# Patient Record
Sex: Male | Born: 1935 | Race: White | Hispanic: No | Marital: Married | Smoking: Former smoker
Health system: Southern US, Community
[De-identification: ages and names within clinical notes are randomized; demographics above are authoritative.]

## PROBLEM LIST (undated history)

## (undated) DIAGNOSIS — R0989 Other specified symptoms and signs involving the circulatory and respiratory systems: Secondary | ICD-10-CM

## (undated) DIAGNOSIS — J189 Pneumonia, unspecified organism: Secondary | ICD-10-CM

## (undated) DIAGNOSIS — M1990 Unspecified osteoarthritis, unspecified site: Secondary | ICD-10-CM

## (undated) DIAGNOSIS — C61 Malignant neoplasm of prostate: Secondary | ICD-10-CM

## (undated) DIAGNOSIS — K8020 Calculus of gallbladder without cholecystitis without obstruction: Secondary | ICD-10-CM

## (undated) DIAGNOSIS — I739 Peripheral vascular disease, unspecified: Secondary | ICD-10-CM

## (undated) DIAGNOSIS — D126 Benign neoplasm of colon, unspecified: Secondary | ICD-10-CM

## (undated) DIAGNOSIS — E785 Hyperlipidemia, unspecified: Secondary | ICD-10-CM

## (undated) DIAGNOSIS — M4310 Spondylolisthesis, site unspecified: Secondary | ICD-10-CM

## (undated) DIAGNOSIS — M47817 Spondylosis without myelopathy or radiculopathy, lumbosacral region: Secondary | ICD-10-CM

## (undated) DIAGNOSIS — J449 Chronic obstructive pulmonary disease, unspecified: Secondary | ICD-10-CM

## (undated) DIAGNOSIS — K219 Gastro-esophageal reflux disease without esophagitis: Secondary | ICD-10-CM

## (undated) DIAGNOSIS — M8580 Other specified disorders of bone density and structure, unspecified site: Secondary | ICD-10-CM

## (undated) DIAGNOSIS — C4491 Basal cell carcinoma of skin, unspecified: Secondary | ICD-10-CM

## (undated) HISTORY — DX: Chronic obstructive pulmonary disease, unspecified: J44.9

## (undated) HISTORY — DX: Malignant neoplasm of prostate: C61

## (undated) HISTORY — DX: Unspecified osteoarthritis, unspecified site: M19.90

## (undated) HISTORY — DX: Peripheral vascular disease, unspecified: I73.9

## (undated) HISTORY — DX: Basal cell carcinoma of skin, unspecified: C44.91

## (undated) HISTORY — DX: Spondylosis without myelopathy or radiculopathy, lumbosacral region: M47.817

## (undated) HISTORY — DX: Benign neoplasm of colon, unspecified: D12.6

## (undated) HISTORY — DX: Other specified disorders of bone density and structure, unspecified site: M85.80

## (undated) HISTORY — PX: OTHER SURGICAL HISTORY: SHX169

## (undated) HISTORY — DX: Pneumonia, unspecified organism: J18.9

## (undated) HISTORY — DX: Spondylolisthesis, site unspecified: M43.10

## (undated) HISTORY — PX: CHOLECYSTECTOMY: SHX55

## (undated) HISTORY — DX: Hyperlipidemia, unspecified: E78.5

## (undated) HISTORY — DX: Calculus of gallbladder without cholecystitis without obstruction: K80.20

## (undated) HISTORY — DX: Other specified symptoms and signs involving the circulatory and respiratory systems: R09.89

---

## 1988-11-08 HISTORY — PX: EYE SURGERY: SHX253

## 1997-10-04 ENCOUNTER — Ambulatory Visit (HOSPITAL_COMMUNITY): Admission: RE | Admit: 1997-10-04 | Discharge: 1997-10-04 | Payer: Self-pay | Admitting: Rheumatology

## 1998-08-10 ENCOUNTER — Emergency Department (HOSPITAL_COMMUNITY): Admission: EM | Admit: 1998-08-10 | Discharge: 1998-08-10 | Payer: Self-pay | Admitting: Internal Medicine

## 2000-01-01 ENCOUNTER — Ambulatory Visit (HOSPITAL_COMMUNITY): Admission: RE | Admit: 2000-01-01 | Discharge: 2000-01-01 | Payer: Self-pay | Admitting: Ophthalmology

## 2000-01-09 ENCOUNTER — Ambulatory Visit (HOSPITAL_COMMUNITY): Admission: RE | Admit: 2000-01-09 | Discharge: 2000-01-09 | Payer: Self-pay | Admitting: Ophthalmology

## 2000-02-26 ENCOUNTER — Inpatient Hospital Stay (HOSPITAL_COMMUNITY): Admission: RE | Admit: 2000-02-26 | Discharge: 2000-03-02 | Payer: Self-pay | Admitting: Orthopaedic Surgery

## 2000-02-26 ENCOUNTER — Encounter (INDEPENDENT_AMBULATORY_CARE_PROVIDER_SITE_OTHER): Payer: Self-pay | Admitting: *Deleted

## 2002-10-05 ENCOUNTER — Encounter: Payer: Self-pay | Admitting: Unknown Physician Specialty

## 2002-10-05 ENCOUNTER — Ambulatory Visit (HOSPITAL_COMMUNITY): Admission: RE | Admit: 2002-10-05 | Discharge: 2002-10-05 | Payer: Self-pay | Admitting: Unknown Physician Specialty

## 2002-12-02 ENCOUNTER — Encounter (INDEPENDENT_AMBULATORY_CARE_PROVIDER_SITE_OTHER): Payer: Self-pay | Admitting: *Deleted

## 2002-12-02 ENCOUNTER — Ambulatory Visit (HOSPITAL_COMMUNITY): Admission: RE | Admit: 2002-12-02 | Discharge: 2002-12-02 | Payer: Self-pay | Admitting: Gastroenterology

## 2003-05-30 ENCOUNTER — Ambulatory Visit (HOSPITAL_BASED_OUTPATIENT_CLINIC_OR_DEPARTMENT_OTHER): Admission: RE | Admit: 2003-05-30 | Discharge: 2003-05-30 | Payer: Self-pay | Admitting: Orthopedic Surgery

## 2004-03-26 ENCOUNTER — Ambulatory Visit (HOSPITAL_COMMUNITY): Admission: RE | Admit: 2004-03-26 | Discharge: 2004-03-26 | Payer: Self-pay | Admitting: Family Medicine

## 2004-03-29 ENCOUNTER — Encounter: Admission: RE | Admit: 2004-03-29 | Discharge: 2004-03-29 | Payer: Self-pay | Admitting: General Surgery

## 2004-04-17 ENCOUNTER — Encounter (INDEPENDENT_AMBULATORY_CARE_PROVIDER_SITE_OTHER): Payer: Self-pay | Admitting: *Deleted

## 2004-04-17 ENCOUNTER — Observation Stay (HOSPITAL_COMMUNITY): Admission: RE | Admit: 2004-04-17 | Discharge: 2004-04-18 | Payer: Self-pay | Admitting: General Surgery

## 2004-09-10 ENCOUNTER — Observation Stay (HOSPITAL_COMMUNITY): Admission: EM | Admit: 2004-09-10 | Discharge: 2004-09-11 | Payer: Self-pay | Admitting: Emergency Medicine

## 2005-06-01 ENCOUNTER — Inpatient Hospital Stay (HOSPITAL_COMMUNITY): Admission: EM | Admit: 2005-06-01 | Discharge: 2005-06-06 | Payer: Self-pay | Admitting: Emergency Medicine

## 2005-06-01 ENCOUNTER — Ambulatory Visit: Payer: Self-pay | Admitting: Internal Medicine

## 2005-06-02 ENCOUNTER — Ambulatory Visit: Payer: Self-pay | Admitting: *Deleted

## 2005-06-04 ENCOUNTER — Encounter (INDEPENDENT_AMBULATORY_CARE_PROVIDER_SITE_OTHER): Payer: Self-pay | Admitting: Specialist

## 2005-06-12 ENCOUNTER — Ambulatory Visit: Payer: Self-pay | Admitting: *Deleted

## 2005-06-23 ENCOUNTER — Ambulatory Visit: Payer: Self-pay | Admitting: Cardiology

## 2005-06-23 ENCOUNTER — Encounter (HOSPITAL_COMMUNITY): Admission: RE | Admit: 2005-06-23 | Discharge: 2005-07-23 | Payer: Self-pay | Admitting: *Deleted

## 2005-06-28 ENCOUNTER — Emergency Department (HOSPITAL_COMMUNITY): Admission: EM | Admit: 2005-06-28 | Discharge: 2005-06-28 | Payer: Self-pay | Admitting: Emergency Medicine

## 2005-06-30 ENCOUNTER — Ambulatory Visit: Payer: Self-pay | Admitting: *Deleted

## 2005-12-18 ENCOUNTER — Emergency Department (HOSPITAL_COMMUNITY): Admission: EM | Admit: 2005-12-18 | Discharge: 2005-12-18 | Payer: Self-pay | Admitting: Emergency Medicine

## 2008-06-07 ENCOUNTER — Ambulatory Visit: Payer: Self-pay | Admitting: Cardiology

## 2008-06-16 ENCOUNTER — Encounter: Payer: Self-pay | Admitting: Cardiology

## 2008-06-16 ENCOUNTER — Ambulatory Visit: Payer: Self-pay

## 2008-06-27 DIAGNOSIS — C61 Malignant neoplasm of prostate: Secondary | ICD-10-CM

## 2008-06-27 DIAGNOSIS — M069 Rheumatoid arthritis, unspecified: Secondary | ICD-10-CM | POA: Insufficient documentation

## 2008-06-27 DIAGNOSIS — I080 Rheumatic disorders of both mitral and aortic valves: Secondary | ICD-10-CM

## 2008-06-27 DIAGNOSIS — I359 Nonrheumatic aortic valve disorder, unspecified: Secondary | ICD-10-CM | POA: Insufficient documentation

## 2008-06-28 ENCOUNTER — Ambulatory Visit: Payer: Self-pay | Admitting: Cardiology

## 2008-11-22 ENCOUNTER — Encounter: Payer: Self-pay | Admitting: Cardiology

## 2009-01-12 ENCOUNTER — Encounter: Payer: Self-pay | Admitting: Cardiology

## 2009-01-12 ENCOUNTER — Encounter: Payer: Self-pay | Admitting: Internal Medicine

## 2009-01-13 ENCOUNTER — Inpatient Hospital Stay (HOSPITAL_COMMUNITY): Admission: EM | Admit: 2009-01-13 | Discharge: 2009-01-15 | Payer: Self-pay | Admitting: Emergency Medicine

## 2009-01-13 ENCOUNTER — Ambulatory Visit: Payer: Self-pay | Admitting: Internal Medicine

## 2009-01-14 ENCOUNTER — Encounter: Payer: Self-pay | Admitting: Internal Medicine

## 2009-01-15 ENCOUNTER — Encounter (INDEPENDENT_AMBULATORY_CARE_PROVIDER_SITE_OTHER): Payer: Self-pay | Admitting: *Deleted

## 2009-01-16 ENCOUNTER — Encounter: Payer: Self-pay | Admitting: Internal Medicine

## 2009-01-28 ENCOUNTER — Inpatient Hospital Stay (HOSPITAL_COMMUNITY): Admission: EM | Admit: 2009-01-28 | Discharge: 2009-01-31 | Payer: Self-pay | Admitting: Emergency Medicine

## 2009-02-14 ENCOUNTER — Encounter: Payer: Self-pay | Admitting: Cardiology

## 2009-02-14 ENCOUNTER — Encounter: Payer: Self-pay | Admitting: Internal Medicine

## 2009-02-15 ENCOUNTER — Ambulatory Visit: Payer: Self-pay | Admitting: Internal Medicine

## 2009-02-15 DIAGNOSIS — Z860100 Personal history of colon polyps, unspecified: Secondary | ICD-10-CM | POA: Insufficient documentation

## 2009-02-15 DIAGNOSIS — K648 Other hemorrhoids: Secondary | ICD-10-CM | POA: Insufficient documentation

## 2009-04-17 ENCOUNTER — Encounter: Payer: Self-pay | Admitting: Cardiology

## 2009-04-17 ENCOUNTER — Ambulatory Visit: Payer: Self-pay | Admitting: Cardiovascular Disease

## 2009-04-17 ENCOUNTER — Ambulatory Visit: Payer: Self-pay | Admitting: Internal Medicine

## 2009-04-17 ENCOUNTER — Observation Stay (HOSPITAL_COMMUNITY): Admission: EM | Admit: 2009-04-17 | Discharge: 2009-04-18 | Payer: Self-pay | Admitting: Emergency Medicine

## 2009-04-18 ENCOUNTER — Encounter (INDEPENDENT_AMBULATORY_CARE_PROVIDER_SITE_OTHER): Payer: Self-pay | Admitting: Emergency Medicine

## 2009-06-15 ENCOUNTER — Telehealth: Payer: Self-pay | Admitting: Internal Medicine

## 2009-12-25 LAB — HEMOCCULT GUIAC POC 1CARD (OFFICE)

## 2010-04-11 NOTE — Progress Notes (Signed)
Summary: Blood in bowels  Phone Note Call from Patient Call back at Home Phone [phone]   Caller: Spouse-Mattie Call For: Dr Leone Payor Reason for Call: Talk to Nurse Summary of Call: Blood in his bowels. Unsure what to do. Initial call taken by: Leanor Kail [facility],  June 15, 2009 9:23 AM  Follow-up for Phone Call        phone busy, I will continue to try and reach patient  Darcey Nora RN, [facility]  June 15, 2009 10:26 AM  Patient with blood in the stool and blood on tissue when he wipes.  I have scheduled an REV for 07/23/09 11:30, I have also called in a refill for his suppositories for hemorrhoids. Follow-up by: Darcey Nora RN, CGRN,  June 15, 2009 11:02 AM    New/Updated Medications: ANUSOL-HC 25 MG  SUPP (HYDROCORTISONE ACETATE) insert rectally as needed for hemorrhoidal pain/bleeding Prescriptions: ANUSOL-HC 25 MG  SUPP (HYDROCORTISONE ACETATE) insert rectally as needed for hemorrhoidal pain/bleeding  #14 x 0   Entered by:   Darcey Nora RN, CGRN   Authorized by:   Iva Boop MD, [facility]   Signed by:   Darcey Nora RN, CGRN on 06/15/2009   Method used:   Faxed to ...       [facility] (retail)       [address]       [place], [place]  [address]       Ph: [phone]       Fax: [phone]   RxID:   [id number]

## 2010-04-11 NOTE — Letter (Signed)
Summary: [name]  [facility] Office Note   Imported By: Roderic Ovens 05/04/2009 16:18:38  _____________________________________________________________________  External Attachment:    Type:   Image     Comment:   External Document  Appended Document: [name] Note reviewed. Patient sent to ER by [facility]. TS

## 2010-05-29 LAB — COMPREHENSIVE METABOLIC PANEL
ALT: 10 U/L (ref 0–53)
ALT: 11 U/L (ref 0–53)
AST: 20 U/L (ref 0–37)
AST: 22 U/L (ref 0–37)
Albumin: 3.2 g/dL — ABNORMAL LOW (ref 3.5–5.2)
Alkaline Phosphatase: 55 U/L (ref 39–117)
CO2: 23 mEq/L (ref 19–32)
Calcium: 8.7 mg/dL (ref 8.4–10.5)
Chloride: 110 mEq/L (ref 96–112)
Creatinine, Ser: 0.74 mg/dL (ref 0.4–1.5)
GFR calc Af Amer: >60 mL/min (ref >60)
GFR calc Af Amer: >60 mL/min (ref >60)
GFR calc non Af Amer: >60 mL/min (ref >60)
Glucose, Bld: 60 mg/dL — ABNORMAL LOW (ref 70–99)
Potassium: 3.5 mEq/L (ref 3.5–5.1)
Sodium: 139 mEq/L (ref 135–145)
Sodium: 140 mEq/L (ref 135–145)
Total Bilirubin: 0.9 mg/dL (ref 0.3–1.2)
Total Protein: 6.9 g/dL (ref 6.0–8.3)

## 2010-05-29 LAB — URINALYSIS, ROUTINE W REFLEX MICROSCOPIC
Glucose, UA: NEGATIVE mg/dL
Ketones, ur: NEGATIVE mg/dL
Nitrite: NEGATIVE
Protein, ur: NEGATIVE mg/dL
Urobilinogen, UA: 0.2 mg/dL (ref 0.0–1.0)

## 2010-05-29 LAB — POCT I-STAT, CHEM 8
Creatinine, Ser: 0.7 mg/dL (ref 0.4–1.5)
Glucose, Bld: 90 mg/dL (ref 70–99)
Hemoglobin: 12.2 g/dL — ABNORMAL LOW (ref 13.0–17.0)
TCO2: 27 mmol/L (ref 0–100)

## 2010-05-29 LAB — DIFFERENTIAL
Basophils Absolute: 0 10*3/uL (ref 0.0–0.1)
Lymphocytes Relative: 7 % — ABNORMAL LOW (ref 12–46)
Lymphs Abs: 0.6 10*3/uL — ABNORMAL LOW (ref 0.7–4.0)
Neutro Abs: 7.3 10*3/uL (ref 1.7–7.7)
Neutrophils Relative %: 87 % — ABNORMAL HIGH (ref 43–77)

## 2010-05-29 LAB — CARDIAC PANEL(CRET KIN+CKTOT+MB+TROPI)
CK, MB: 1.2 ng/mL (ref 0.3–4.0)
Relative Index: INVALID (ref 0.0–2.5)
Total CK: 63 U/L (ref 7–232)

## 2010-05-29 LAB — CBC
MCV: 91.1 fL (ref 78.0–100.0)
Platelets: 199 10*3/uL (ref 150–400)
RBC: 3.32 MIL/uL — ABNORMAL LOW (ref 4.22–5.81)
RDW: 17.2 % — ABNORMAL HIGH (ref 11.5–15.5)
WBC: 4.7 10*3/uL (ref 4.0–10.5)
WBC: 8.4 10*3/uL (ref 4.0–10.5)

## 2010-05-29 LAB — CK TOTAL AND CKMB (NOT AT ARMC)
Relative Index: INVALID (ref 0.0–2.5)
Total CK: 28 U/L (ref 7–232)

## 2010-05-29 LAB — GLUCOSE, CAPILLARY: Glucose-Capillary: 86 mg/dL (ref 70–99)

## 2010-05-29 LAB — PROTIME-INR
INR: 1.09 (ref 0.00–1.49)
Prothrombin Time: 14 seconds (ref 11.6–15.2)

## 2010-05-29 LAB — D-DIMER, QUANTITATIVE: D-Dimer, Quant: 1.51 ug/mL-FEU — ABNORMAL HIGH (ref 0.00–0.48)

## 2010-05-29 LAB — TSH: TSH: 2.827 u[IU]/mL (ref 0.350–4.500)

## 2010-05-29 LAB — CORTISOL: Cortisol, Plasma: 16.4 ug/dL

## 2010-06-11 ENCOUNTER — Encounter: Payer: Self-pay | Admitting: Family Medicine

## 2010-06-11 DIAGNOSIS — I739 Peripheral vascular disease, unspecified: Secondary | ICD-10-CM | POA: Insufficient documentation

## 2010-06-11 DIAGNOSIS — R0989 Other specified symptoms and signs involving the circulatory and respiratory systems: Secondary | ICD-10-CM

## 2010-06-11 DIAGNOSIS — J449 Chronic obstructive pulmonary disease, unspecified: Secondary | ICD-10-CM

## 2010-06-11 DIAGNOSIS — M4310 Spondylolisthesis, site unspecified: Secondary | ICD-10-CM | POA: Insufficient documentation

## 2010-06-11 DIAGNOSIS — M47817 Spondylosis without myelopathy or radiculopathy, lumbosacral region: Secondary | ICD-10-CM

## 2010-06-11 DIAGNOSIS — E785 Hyperlipidemia, unspecified: Secondary | ICD-10-CM

## 2010-06-11 DIAGNOSIS — M8580 Other specified disorders of bone density and structure, unspecified site: Secondary | ICD-10-CM | POA: Insufficient documentation

## 2010-06-11 DIAGNOSIS — C4491 Basal cell carcinoma of skin, unspecified: Secondary | ICD-10-CM | POA: Insufficient documentation

## 2010-06-12 LAB — COMPREHENSIVE METABOLIC PANEL
ALT: 12 U/L (ref 0–53)
AST: 22 U/L (ref 0–37)
Albumin: 3.4 g/dL — ABNORMAL LOW (ref 3.5–5.2)
Albumin: 3.1 g/dL — ABNORMAL LOW (ref 3.5–5.2)
Albumin: 3.8 g/dL (ref 3.5–5.2)
Alkaline Phosphatase: 60 U/L (ref 39–117)
Alkaline Phosphatase: 69 U/L (ref 39–117)
BUN: 13 mg/dL (ref 6–23)
BUN: 9 mg/dL (ref 6–23)
Calcium: 8.4 mg/dL (ref 8.4–10.5)
Calcium: 8.8 mg/dL (ref 8.4–10.5)
Chloride: 106 mEq/L (ref 96–112)
Chloride: 105 mEq/L (ref 96–112)
Creatinine, Ser: 0.73 mg/dL (ref 0.4–1.5)
GFR calc Af Amer: >60 mL/min (ref >60)
Glucose, Bld: 100 mg/dL — ABNORMAL HIGH (ref 70–99)
Potassium: 3.5 mEq/L (ref 3.5–5.1)
Potassium: 3.7 mEq/L (ref 3.5–5.1)
Sodium: 140 mEq/L (ref 135–145)
Sodium: 143 mEq/L (ref 135–145)
Total Bilirubin: 0.9 mg/dL (ref 0.3–1.2)
Total Protein: 6.4 g/dL (ref 6.0–8.3)
Total Protein: 6.2 g/dL (ref 6.0–8.3)
Total Protein: 7.4 g/dL (ref 6.0–8.3)

## 2010-06-12 LAB — CROSSMATCH: ABO/RH(D): O POS

## 2010-06-12 LAB — TYPE AND SCREEN: Antibody Screen: NEGATIVE

## 2010-06-12 LAB — DIFFERENTIAL
Eosinophils Relative: 1 % (ref 0–5)
Eosinophils Relative: 1 % (ref 0–5)
Lymphocytes Relative: 17 % (ref 12–46)
Lymphs Abs: 1.3 10*3/uL (ref 0.7–4.0)
Monocytes Absolute: 0.6 10*3/uL (ref 0.1–1.0)
Monocytes Relative: 6 % (ref 3–12)
Monocytes Relative: 8 % (ref 3–12)
Neutro Abs: 7.5 10*3/uL (ref 1.7–7.7)
Neutrophils Relative %: 79 % — ABNORMAL HIGH (ref 43–77)

## 2010-06-12 LAB — HEMOGLOBIN AND HEMATOCRIT, BLOOD
HCT: 29.8 % — ABNORMAL LOW (ref 39.0–52.0)
HCT: 30.1 % — ABNORMAL LOW (ref 39.0–52.0)
HCT: 30.7 % — ABNORMAL LOW (ref 39.0–52.0)
HCT: 31.9 % — ABNORMAL LOW (ref 39.0–52.0)
Hemoglobin: 10.4 g/dL — ABNORMAL LOW (ref 13.0–17.0)
Hemoglobin: 10.4 g/dL — ABNORMAL LOW (ref 13.0–17.0)
Hemoglobin: 10.9 g/dL — ABNORMAL LOW (ref 13.0–17.0)

## 2010-06-12 LAB — CBC
HCT: 31.8 % — ABNORMAL LOW (ref 39.0–52.0)
HCT: 31.1 % — ABNORMAL LOW (ref 39.0–52.0)
Hemoglobin: 10.4 g/dL — ABNORMAL LOW (ref 13.0–17.0)
Hemoglobin: 9.1 g/dL — ABNORMAL LOW (ref 13.0–17.0)
Hemoglobin: 11 g/dL — ABNORMAL LOW (ref 13.0–17.0)
MCHC: 32.5 g/dL (ref 30.0–36.0)
MCHC: 33.3 g/dL (ref 30.0–36.0)
MCHC: 33.8 g/dL (ref 30.0–36.0)
MCHC: 34 g/dL (ref 30.0–36.0)
MCHC: 34.1 g/dL (ref 30.0–36.0)
MCV: 96 fL (ref 78.0–100.0)
MCV: 96.5 fL (ref 78.0–100.0)
Platelets: 168 10*3/uL (ref 150–400)
Platelets: 201 10*3/uL (ref 150–400)
RBC: 2.87 MIL/uL — ABNORMAL LOW (ref 4.22–5.81)
RBC: 2.9 MIL/uL — ABNORMAL LOW (ref 4.22–5.81)
RBC: 3.32 MIL/uL — ABNORMAL LOW (ref 4.22–5.81)
RBC: 3.34 MIL/uL — ABNORMAL LOW (ref 4.22–5.81)
RDW: 16.2 % — ABNORMAL HIGH (ref 11.5–15.5)
WBC: 9.5 10*3/uL (ref 4.0–10.5)
WBC: 7.5 10*3/uL (ref 4.0–10.5)
WBC: 8.5 10*3/uL (ref 4.0–10.5)

## 2010-06-12 LAB — IRON AND TIBC
Iron: 41 ug/dL — ABNORMAL LOW (ref 42–135)
Saturation Ratios: 15 % — ABNORMAL LOW (ref 20–55)
TIBC: 282 ug/dL (ref 215–435)

## 2010-06-12 LAB — FOLATE: Folate: 13.6 ng/mL

## 2010-06-12 LAB — PROTIME-INR
INR: 1.13 (ref 0.00–1.49)
Prothrombin Time: 14.4 seconds (ref 11.6–15.2)

## 2010-06-12 LAB — RETICULOCYTES: Retic Count, Absolute: 70.4 10*3/uL (ref 19.0–186.0)

## 2010-07-23 NOTE — Assessment & Plan Note (Signed)
HEALTHCARE                            CARDIOLOGY OFFICE NOTE   NAME:[name], [name]                       MRN:          [id number]  DATE:06/28/2008                            DOB:          06/11/1930    PRIMARY CARE PHYSICIAN:  Ernestina Penna, MD   REASON FOR PRESENTATION:  Evaluated the patient with leg pain.   HISTORY OF PRESENT ILLNESS:  The patient returns for followup after his  June 07, 2008, appointment.  He was complaining predominantly of leg  and foot pain.  He did have reduced pulses, particularly on the left  leg.  However, ABIs that I ordered demonstrated normal waveform and no  suggestion of obstructive vascular disease.  He also had a heart murmur  and the echocardiogram confirmed moderate mitral regurgitation but  otherwise well-preserved ejection fraction.  Finally, he had carotid  bruits and was found to have less than 40% bilateral carotid stenosis.  He denies any symptoms such as chest pressure, neck or arm discomfort.  He is not having any palpitations, presyncope, or syncope.  He has no  PND or orthopnea.  He gets pain when he stands on his foot or stands on  his feet as described in the previous note.   PAST MEDICAL HISTORY:  1. Mild-to-moderate mitral regurgitation.  2. Aortic sclerosis.  3. Prostate cancer.  4. Rheumatoid arthritis.  5. Left foot surgery.  6. Cholecystectomy.  7. Right hip replacement.   ALLERGIES:  None.   MEDICATIONS:  1. Methotrexate 2.5 mg q.i.d.  2. Hydroxychloroquine.  3. Prednisone 5 mg daily.  4. Iron 324 mg b.i.d.  5. Protonix 40 mg daily.   REVIEW OF SYSTEMS:  As stated in the HPI and otherwise negative for all  other systems.   PHYSICAL EXAMINATION:  GENERAL:  The patient is in no distress.  VITAL SIGNS:  Blood pressure 144/84, heart rate 76 and regular, weight  150 pounds.  HEENT:  Eyes unremarkable.  Pupils equal, round, and reactive to light.  Fundi not visualized.  Oral  mucosa unremarkable.  NECK:  No jugular venous distention at 45 degrees.  Carotid upstroke  brisk and symmetric.  No bruits.  Bilateral carotid bruits.  No  thyromegaly.  LYMPHATICS:  No adenopathy.  LUNGS:  Clear to auscultation bilaterally.  HEART:  PMI not displaced or sustained.  S1 and S2 within normal limits.  No S3, no S4.  3/6 apical systolic murmur radiating to the axilla, no  diastolic murmurs.  ABDOMEN:  Flat.  Positive bowel sounds, normal frequency and pitch.  No  bruits, no rebound, no guarding.  No midline pulsatile mass.  No  hepatomegaly.  SKIN:  No rashes, no nodules.  EXTREMITIES:  A 2+ pulses upper, 2+ femorals without bruits, 2+  popliteals, 2+ posterior tibialis and dorsalis pedis on the right,  absent dorsalis pedis and posterior tibialis on the left.  No cyanosis,  no clubbing, no edema.  NEURO:  Grossly intact.   ASSESSMENT AND PLAN:  1. Foot pain.  Surprisingly, he has normal ABIs.  I would not have  thought this by his physical exam.  However, I cannot now ascribe      his symptoms to claudication and wonder if it could be neuropathic.      No further cardiovascular workup is suggested at this point.  2. Carotid stenosis.  The patient has mild carotid stenosis and this      can be followed in few years.  3. Mitral regurgitation.  This seems to be stable and moderate.  He      should have his heart listen to routinely.  If his murmur gets      louder or changes or if he redevelops any symptoms, I would repeat      an echocardiogram.  Otherwise, I will see him in a couple of years      to follow up on this problem.  4. Followup.  As above.     Rollene Rotunda, MD, [facility]  Electronically Signed    JH/MedQ  DD: 06/28/2008  DT: 06/29/2008  Job #: [id number]   cc:   Ernestina Penna, M.D.

## 2010-07-23 NOTE — Assessment & Plan Note (Signed)
[facility]                            CARDIOLOGY OFFICE NOTE   NAME:Mander, RAJAN BURGARD                       MRN:          [id number]  DATE:06/07/2008                            DOB:          Jul 24, 1930    PRIMARY CARE PHYSICIAN:  Ernestina Penna, MD   REASON FOR PRESENTATION:  Evaluate the patient with claudication.   HISTORY OF PRESENT ILLNESS:  The patient is a pleasant 77 year old  gentleman who was seen in 2007 by Dr. Dorethea Clan.  He had an abnormal  electrocardiogram.  He had an echocardiogram at that time, which  demonstrated a well preserved ejection fraction.  There was mild-to-  moderate mitral regurgitation.  He had some aortic valve sclerosis as  well.  He has not required followup since that time.  However, he was  seeing Dr. Christell Constant recently and complaining of foot pain.  This was  particularly in his left foot.  It happens when he has been standing on  for a while.  He also gets some discomfort when he tries to walk and  this seems to be in right thigh.  The symptoms seem to be consistent  with claudication.  It is slowly getting worse.  He is not describing  any resting pain.  He tries to remain active.  He does not get any chest  pressure, neck or arm discomfort.  He does not have any palpitations,  presyncope, or syncope.  He denies any shortness of breath, PND, or  orthopnea.   PAST MEDICAL HISTORY:  Mild-to-moderate mitral regurgitation, aortic  sclerosis, prostate cancer, and rheumatoid arthritis.   PAST SURGICAL HISTORY:  Left foot surgery, cholecystectomy, and right  hip replacement.   ALLERGIES:  None.   MEDICATIONS:  1. Methotrexate 2.5 mg q.i.d.  2. Hydroxychloroquine.  3. Prednisone 5 mg daily.  4. Iron 324 mg b.i.d.  5. Protonix 40 mg daily.   SOCIAL HISTORY:  The patient quit smoking 15 years ago after 1 pack per  day for 40 years.  He does not drink alcohol.  He is retired.  He is  married.  He has 7 children.   FAMILY HISTORY:  Noncontributory for early coronary artery disease.  A  sister had bypass at age 16.  His father died in a tractor accident and  his mother died of lung cancer at age 30.   REVIEW OF SYSTEMS:  As stated in the HPI and otherwise positive for  joint pain and difficulty hearing, negative for all other systems.   PHYSICAL EXAMINATION:  GENERAL:  The patient is pleasant and in no  distress.  VITAL SIGNS:  Blood pressure 140/86, heart rate 80 and regular, body  mass index 20, and weight 150 pounds.  HEENT:  Eyelids unremarkable; pupils equal, round, and reactive to  light; fundi not visualized; oral mucosa unremarkable.  Edentulous.  NECK:  No jugular venous distention at 45 degrees, carotid upstroke  brisk and symmetric, soft carotid bruits, no thyromegaly.  LYMPHATICS:  No cervical, axillary, or inguinal adenopathy.  LUNGS:  Clear to auscultation bilaterally.  BACK:  No costovertebral  angle tenderness.  CHEST:  Unremarkable.  HEART:  PMI not displaced or sustained; S1 and S2 within normal limits,  no S3, no S4; 3/6 apical systolic murmur radiating slightly to the  axilla, holosystolic and radiating slightly out the aortic outflow tract  as well, no diastolic murmurs.  ABDOMEN:  Flat; positive bowel sounds, normal in frequency and pitch; no  bruits, no rebound, no guarding, no midline pulsatile mass, no  hepatomegaly, no splenomegaly.  SKIN:  No rashes, no nodules.  EXTREMITIES:  Upper pulses 2+, 2+ femorals without bruits, 2+  popliteals, 2+ posterior tibialis and dorsalis pedis on the right, but  absent dorsalis pedis and posterior tibialis on the left; no cyanosis,  no clubbing, no edema.  NEURO:  Oriented to person, place, and time; cranial nerves II through  XII grossly intact; motor grossly intact.   EKG; sinus rhythm, rate 80, axis within normal limits, intervals within  normal limits, ST depression, but not changed from previous EKGs at Dr.  Kathi Der office.    ASSESSMENT AND PLAN:  1. Claudication.  The patient is describing some symptoms consistent      with claudication.  He has reduced pulses in his left foot.  I am      going to measure ABIs.  Further management will be based on these      results.  2. Carotid bruits.  He will get carotid Dopplers as well.  3. Mitral regurgitation.  The patient does have murmur consistent with      mitral regurgitation and probably separate murmur consistent with      aortic sclerosis.  It has been 3 years since his last echo and this      needs to be reevaluated.  He will get an echocardiogram.  4. Followup.  I will see him back after the results of all these      studies to further make plans.     Rollene Rotunda, MD, [facility]  Electronically Signed    JH/MedQ  DD: 06/07/2008  DT: 06/08/2008  Job #: [id number]   cc:   Ernestina Penna, M.D.

## 2010-07-26 NOTE — H&P (Signed)
NAME:  [name], [name] NO.:  [id number]   MEDICAL RECORD NO.:  [id number]          PATIENT TYPE:  EMS   LOCATION:  ED                           FACILITY:  [facility]   PHYSICIAN:  Burnard Bunting, M.D.    DATE OF BIRTH:  Apr 03, 1930   DATE OF ADMISSION:  06/28/2005  DATE OF DISCHARGE:                                HISTORY & PHYSICAL   CHIEF COMPLAINT:  Right hip pain.   HISTORY OF PRESENT ILLNESS:  [name] is a 77 year old patient  status post total hip replacement performed by Dr. Ophelia Charter in 2001 with  subsequent dislocation in July, 2006 and again tonight. He was bending over  to tie his shoes and had his hip pop out.   PAST MEDICAL HISTORY:  1.  Notable for rheumatoid arthritis.  2.  Three weeks ago he had Salmonella poisoning and he underwent 8 days of      antibiotic treatment for that.  3.  He did have an EKG stress-test 5 days ago which showed no ischemia.   FAMILY HISTORY:  Family medical history is noncontributory. No history of  DVT.   SOCIAL HISTORY:  He is married for 55 years. Lives in [place].   ALLERGIES:  No known drug allergies.   CURRENT MEDICATIONS:  1.  Methotrexate.  2.  Prednisone.  3.  Hydroxychloroquine.  4.  Darvocet.   REVIEW OF SYSTEMS:  Noncontributory.   PHYSICAL EXAMINATION:  VITAL SIGNS:  Temperature is 98, pulse 83,  respiration 18, blood pressure 140/70.  CHEST:  Clear to auscultation.  HEART:  Regular rate and rhythm.  ABDOMEN:  Benign.  EXTREMITIES:  The right lower extremity is shortened and internally rotated.  Dorsiflexion, plantar flexion is intact. Dorsalis pedis pulse is intact.   STUDIES:  Hemoglobin is 9.5. He has old ST depression on his EKG. Chest x-  ray is clear. Radiographs show a right posterior hip dislocation.   IMPRESSION:  Right posterior hip dislocation.   PLAN:  General anesthetic with closed reduction. Risks and benefits are  discussed with the patient which include but are not limited  to infection,  bone breakage, failure to reduce the hip. Patient understands and wishes to  proceed. All questions answered.           ______________________________  G. Dorene Grebe, M.D.     GSD/MEDQ  D:  06/28/2005  T:  06/28/2005  Job:  [id number]

## 2010-07-26 NOTE — Op Note (Signed)
[name], [name]                ACCOUNT NO.:  [id number]   MEDICAL RECORD NO.:  [id number]          PATIENT TYPE:  EMS   LOCATION:  ED                           FACILITY:  [facility]   PHYSICIAN:  Mark C. Ophelia Charter, M.D.    DATE OF BIRTH:  1930/08/09   DATE OF PROCEDURE:  12/18/2005  DATE OF DISCHARGE:  12/18/2005                                 OPERATIVE REPORT   PREOPERATIVE DIAGNOSIS:  Closed posterior total hip arthroplasty  dislocation, right.   POSTOPERATIVE DIAGNOSIS:  Closed posterior total hip arthroplasty  dislocation, right.   OPERATION PERFORMED:  Closed reduction under general anesthesia, right hip.   SURGEON:  Mark C. Ophelia Charter, M.D.   ANESTHESIA:  General mask.   DESCRIPTION OF PROCEDURE:  After induction of general mask anesthesia after  succinylcholine was given, the hip was flexed 90-90 position with the  surgeon standing and the knee between my legs, countertraction of the  pelvis, gentle traction, hip popped in easily with a nice snap.  Flat plate  x-ray confirmed relocation.  Knee immobilizer applied.  Patient transferred  to recovery room in stable condition.  This is the third dislocation this  patient has had and this occurred when he was sitting on a stair the second  or third step trying to get up by grabbing the rails, flexing his chest to  his knees, internally rotating his hip and pulling.  Transferred to recovery  room in stable condition.  Instrument count, needle count was correct.      Mark C. Ophelia Charter, M.D.  Electronically Signed     MCY/MEDQ  D:  12/18/2005  T:  12/20/2005  Job:  [id number]

## 2010-07-26 NOTE — Discharge Summary (Signed)
[place]  Patient:    [name], [name]                         MRN: [id number] Adm. Date:  [id number] Disc. Date: [id number] Attending:  Jacki Cones                           Discharge Summary  FINAL DIAGNOSIS:  Right hip rheumatoid arthritis.  HISTORY OF PRESENT ILLNESS:  This 77 year old male has seropositive rheumatoid arthritis and past history of prostate cancer and was referred to me by Dr. Larey Dresser for evaluation.  He is on methotrexate and prednisone, hydroxychloroquine, Darvocet, and hydrocodone for his rheumatoid arthritis pain.  See H&P for dosages.  He failed conservative treatment and was admitted for a total hip arthroplasty since he had significant protrusio.  HOSPITAL COURSE:  The patient was admitted and underwent right total hip arthroplasty by Dr. Annell Greening, with Dr. Odette Fraction assisting, under general anesthesia, with estimated blood loss of 450 ml.  The patient had a cemented femoral stem, Press-Fit acetabulum.  Postoperatively the patient had DVT prophylaxis with Coumadin.  PT, OT consult.  Care management for DMEs. Postoperative hemoglobin was 8.8 and stable.  INR was 1.6 on February 28, 2000.  He made slow, gradual progress.  Was ready for discharge home on March 02, 2000.  DISCHARGE MEDICATIONS:  He was given prescription for Coumadin, Vicodin for pain.  Incision was dry.  CONDITION ON DISCHARGE:  Stable. DD:  04/01/00 TD:  04/02/00 Job: [id number]

## 2010-07-26 NOTE — H&P (Signed)
[place]  Patient:    [name], [name]                         MRN: [id number] Adm. Date:  [id number] Attending:  Jacki Cones                         History and Physical  ADMISSION DIAGNOSIS:  Right hip painful rheumatoid arthritis, with destruction.  HISTORY OF PRESENT ILLNESS:  This 77 year old male was referred to me by Dr. Maretta Bees. Vonita Moss, who follows him for prostate cancer, when he had progressive severe right hip pain, with great difficulty ambulating.  He is treated by Dr. Demetria Pore. Levitin, who is his rheumatologist.  CURRENT MEDICATIONS:  He has been on methotrexate 4 mg one p.o. weekly, prednisone 10 mg q.d., hydroxychloroquine p.o. b.i.d., Darvocet b.i.d., and Vicodin increasing dosage for his pain.  ALLERGIES:  No known drug allergies.  PAST MEDICAL HISTORY: 1. Positive for his rheumatoid arthritis. 2. Prostate cancer. 3. Cataract surgery in 1999.  FAMILY HISTORY:  Positive for lung disease.  REVIEW OF SYSTEMS:  The patient had previous cataract surgery.  Seropositive rheumatoid arthritis.  Prostate carcinoma.  The patient does have dentures.  PHYSICAL EXAMINATION:  VITAL SIGNS:  temperature 97.4 degrees, pulse 76, respirations 14, blood pressure 134/72, weight 165 pounds, height 6 feet 1 inches.  GENERAL:  A well-developed, well-nourished, thin male.  The patient has slow deliberate motions getting from the sitting to the standing position.  He ambulates with a significant limp.  He has bilateral hand deformities.  HEENT:  Upper and lower dentures.  TMI.  Pharynx clear.  NECK:  Supple.  BACK:  No kyphotic deformity.  LUNGS:  Clear to auscultation.  HEART:  A regular rate and rhythm without murmur.  ABDOMEN:  Soft, nontender.  No organomegaly.  EXTREMITIES:  There are bilateral wrist and hand deformities with mild tenosynovitis, without evidence of extensor rupture or significant subluxation.  Right hip  shows internal rotation of only 20 degrees, and external rotation of 20 degrees, 60 degrees of hip flexion, which reproduces his pain.  ____ of the left hip has 45 ER, 40 IR, and flexion to 110.  Pulses 2+.  Sciatic nerve function intact.  X-RAYS:  X-rays show severe destruction with mild protrusio, large marginal osteophytes on the acetabulum, and a complete loss of joint space with marginal erosions, secondary to rheumatoid arthritis.  ASSESSMENT:  Right hip severe rheumatoid arthritis with destruction.  PLAN:  Due to the patients use of a cane and progressive increasing amounts of Vicodin, and great difficulty ambulating, and pain that wakes him up at night, and difficulty with daily activities, I would recommend proceeding with a total hip arthroplasty.  He is not a candidate for physical therapy preoperatively, in order to avoid surgery.  He is on maximum medical treatment by Dr. Coral Spikes.  The planned procedure is discussed with the patient.  The risks were discussed.  He understands and requests that we proceed.  The surgery is scheduled on February 26, 2000. DD:  02/26/00 TD:  02/26/00 Job: [id number]

## 2010-07-26 NOTE — H&P (Signed)
NAMEPASHA, [name]                ACCOUNT NO.:  [id number]   MEDICAL RECORD NO.:  [id number]          PATIENT TYPE:  INP   LOCATION:  A204                          FACILITY:  [facility]   PHYSICIAN:  Osvaldo Shipper, MD     DATE OF BIRTH:  1930/04/11   DATE OF ADMISSION:  05/31/2005  DATE OF DISCHARGE:  LH                                HISTORY & PHYSICAL   PRIMARY DOCTOR:  Dr. Christell Constant from [facility].   ONCOLOGIST:  Dr. Vonita Moss.   DERMATOLOGIST:  Dr. Ladona Horns.   ADMITTING DIAGNOSES:  1.  Acute viral gastroenteritis.  2.  Abnormal electrocardiogram requiring further evaluation.  3.  Anemia.  4.  Dehydration.  5.  History of rheumatoid arthritis.   CHIEF COMPLAINT:  Nausea, vomiting, diarrhea for two days.   HISTORY OF PRESENT ILLNESS:  The patient is a 77 year old Caucasian male  with a past medical history of rheumatoid arthritis, who was doing well  until about yesterday morning when he woke up and had an episode of  diarrhea.  This was followed by a nauseous feeling which was followed by  emesis.  He vomited once.  His diarrhea was loose to begin with,  subsequently becoming extremely watery.  He must have had at least more than  12 bowel movements since yesterday and today.  He states whatever he was  eating or drinking would go straight down.  Denied any blood in his stool  though he said at one point there was some reddish colored fluid which he  attributes to red Gatorade.  He has some vague generalized abdominal  discomfort but nothing specific.  He had some fever at home yesterday which  was 101.6.  There is a history of patient eating out on Thursday evening.  He had some kind of a burger.  This afternoon when he was having diarrhea,  he had a few episodes of feeling light-headed and dizzy especially when he  would stand up.  Also when he was in the restroom he had one syncopal  episode.  He did not hurt his head anywhere.  Denied chest pain,  shortness  of breath.  He states he has been having shoulder pain in both his shoulders  for the past two weeks.  There is no pain when he is lying still but when he  moves his arm, especially when he raises it up his head, he feels pain in  his shoulders.   MEDICATIONS AT HOME:  1.  Hydrochlorothiazide which he states he takes for arthritis.  2.  Methotrexate three pills weekly on Sundays, does not know the dosage.  3.  Darvocet for pain.  4.  Prednisone 5 mg daily for rheumatoid arthritis.  5.  Lupron shots every few months for prostate cancer.   ALLERGIES:  No known drug allergies.   PAST MEDICAL HISTORY:  1.  History of rheumatoid arthritis for the past many years.  2.  History of prostate cancer.  His prostate cancer was diagnosed in 1993.   PAST SURGICAL HISTORY:  1.  Total right hip replacement  because of arthritis.  2.  Status post foot and toe surgeries.  3.  Cholecystectomy done in the past.   No history of any heart disease that he knows of.   SOCIAL HISTORY:  Lives in [place] with his wife.  They are involved in  cleaning doctors's offices.  Quit smoking many years ago.  No alcohol use.  No illicit drug use.  He sometimes needs to use a cane.  Otherwise, he is  pretty independent.   FAMILY HISTORY:  Father had heart disease in his 53s, died of in a motor  vehicle accident.  Mother died of lung cancer.  She was 82.  All of his  siblings are healthy.  He had a son who had a heart attack at age of 75.   REVIEW OF SYSTEMS:  Ten point review of systems was unremarkable except as  mentioned in the HPI.  There is no dysuria or hematuria.   PHYSICAL EXAMINATION:  VITAL SIGNS:  Temperature 97.2, he was orthostatic in  that his heart rate went from 77 to 94, his blood pressure came down from  108/58 to 92/61.  Pulse oximetry 98% on room air.  Respiratory rate 16.  GENERAL:  This is an elderly white male in no apparent distress.  Feeling  slightly better.  HEENT:  There  is no pallor or icterus.  Mucous membrane slightly dry.  No  oral lesions are seen.  LUNGS:  Clear to auscultation bilaterally.  No wheezing, rales or rhonchi.  CARDIOVASCULAR:  S1, S2 is normal and regular.  No murmurs appreciated.  No  S3, S4 or rubs.  ABDOMEN:  Soft.  Generalized vague discomfort.  No rebound, rigidity or  guarding.  Bowel sounds present and normal.  No mass or organomegaly  present.  EXTREMITIES:  No edema.  No calf tenderness.  Pulses are poor but palpable.  RECTAL:  Exam done which did not reveal any stool whatsoever.  No lesions  were noted otherwise.   LABORATORY DATA:  White count 8.9, hemoglobin 10.3, MCV 90, platelet count  209.  Sodium 135, potassium 3.1, chloride 103, bicarb 22, BUN 25, creatinine  1.0, glucose 102.  LFTs normal.  Albumin 2.8, total protein 6.1. Calcium  8.2.  Lipase 23.  Two sets of cardiac markers unremarkable.  U/A shows amber  colored urine with small bilirubin, some ketones, trace proteins, negative  for infection.   EKG was done which shows sinus rhythm with normal axis.  Intervals appeared  to be within the normal range.  There is a significant ST depression with T  inversion in the inferior leads.  There is also T-wave inversion in V4, V5.  There is poor R wave progression noted.  Repeat EKG shows improvement in  these ST changes but mostly stable.  These two EKGs were compared to an EKG  from July 2006 and these changes are not really seen at that time.  These  are new findings.   No imaging studies have been done.   IMPRESSION:  This is a 77 year old Caucasian male with a history of  rheumatoid arthritis, prostate cancer, who was doing well until yesterday  when he had an episode of vomiting along with diarrhea.  He has been having  pretty significant diarrhea and had one syncopal episode today.  He likely  has viral gastroenteritis causing all of his symptoms.  Syncope probably related to him being orthostatic.  This is  secondary to dehydration but he  does have  some significant EKG findings which are new.  The patient denies  any kind of chest pain or shortness of breath whatsoever.  It is possible  that patient might have had some silent event in between July 2006 and now.  Dr. Rosalia Hammers discussed this case with Dr. Daleen Squibb who recommended doing serial  cardiac enzymes on this patient and having cardiology see him on Monday if  he does rule out.  I think the plan is reasonable.  I do not think he merits  anticoagulation at this time unless his cardiac enzymes come back positive.   PLAN:  Problem 1. Acute gastroenteritis.  Will give him IV fluids and  replete his potassium, hopefully correct his dehydration quickly.  He is  orthostatic.  He is on chronic prednisone, hence he would probably have  suppression of his adrenal axis at this time. Hence, we will give him three  stress dose steroids with hydrocortisone.  Hopefully with fluids, the  patient's condition should improve.  Do stools studies although I do not  think any of them have come back positive.  He does not give a history of  recent antibiotic use as well.  Will give him one dose of Imodium now.  I do  not think this is infectious enteritis as such.   Problem 2. Cardiac.  He does have abnormal EKG. This will need to be fully  evaluated.  If the patient rules in, he will need to be anticoagulated and  probably need workup in the form of cardiac catheterization.  If he rules  out, I believe he can just be kept on aspirin and be evaluated by  cardiologist on Monday or even as an outpatient.  We will try to get an ECHO  if he is still here on Monday.  For now we will just continue aspirin.   Problem 3. Anemia, likely chronic.  However, will check iron profile  studies.  Will check another hemoglobin tomorrow morning.   Problem 4. Low albumin with a normal protein level.  There is reverse A:G  ratio. The patient gives history of weight loss for the past  2-3 years and  he says his physician has worked him up and has not found anything  concerning which could account for the weight loss.  Will check an SPEP and  UPEP.   Problem 5.  Syncope likely related to orthostatics.  Will check a CT of the  head at least to rule out any intracranial process.  We will continue to  monitor this patient and hopefully he should improve with IV fluids.   Problem 6. DVT prophylaxis. We will give him proton pump inhibitors as well.      Osvaldo Shipper, MD  Electronically Signed     GK/MEDQ  D:  06/01/2005  T:  06/01/2005  Job:  [id number]   cc:   Demetria Pore. Coral Spikes, M.D.  Fax: [phone]   Maretta Bees. Vonita Moss, M.D.  Fax: [phone]

## 2010-07-26 NOTE — Consult Note (Signed)
NAMETAJI, BARRETTO                ACCOUNT NO.:  [id number]   MEDICAL RECORD NO.:  [id number]          PATIENT TYPE:  INP   LOCATION:  A204                          FACILITY:  [facility]   PHYSICIAN:  Lionel December, M.D.    DATE OF BIRTH:  1930/11/03   DATE OF CONSULTATION:  06/03/2005  DATE OF DISCHARGE:                                   CONSULTATION   REASON FOR CONSULTATION:  Bloody diarrhea. CT shows changes of colitis  involving distal half of the colon.   HISTORY OF PRESENT ILLNESS:  Johnthomas is a 77 year old Caucasian male with  rheumatoid arthritis who has been in stable condition on therapy until the  evening of May 30, 2005, when he developed chills followed by fever.  His  wife checked his temperatures, 101.6.  That night, he begun throwing up.  Vomiting stopped that night.  However, this was followed by nonbloody  diarrhea. The following day which was a Saturday, he felt weak and had a  syncopal episode.  He was brought to the emergency room.  He had head CT  which was negative for acute abnormalities.  He was hospitalized for further  management.   On admission, his H&H was noted to be low at 10.3 and 31.5.  H&H from  yesterday morning was 9.2 and 28.1.  He has been treated with IV fluids.  His hemoccult x2 has been negative.  His stool WBC cultures ON, P&C,  difficile toxin titer are all negative.  His anemia has been worked up as  well.  His folate level 17.6, B-12 239 which is low normal, serum iron was  11, TIBC 266, saturation was 4% and ferritin was 255.  Yesterday, the  patient felt much better, this morning he experienced lower abdominal cramps  with bloody diarrhea.  His daughter states he passed a large amount of fresh  blood.  He, therefore, was scheduled for CT.  He had more discomfort and  diarrhea after he drank the contrast.  Now that he has passed it, he feels  better.  He states his appetite has decreased, but he is afraid to eat  because he gets gas pains.   He has lost about 9 pounds with this illness.  Prior to that, he was having normal bowel movements.  He had colonoscopy  three years ago in [place] with removal of two small polyps, and he is  scheduled to have surveillance colonoscopy some time this year.  He denies  recent antibiotics or travel outside of this area.  However, he and his wife  eat out just about every day.  He drinks well water, but they get their well  checked regularly.  The patient is also noted to have abnormal EKG.  He has  been seen by Dr. Dorethea Clan of Cardiology, had an echocardiography where right  atrium was enlarged, felt to be possibly due to COPD, but no LV  abnormalities were noted.   CURRENT MEDICATIONS:  1.  ASA 81 mg daily.  2.  Protonix 40 mg IV daily.  3.  Phenergan 6.25 mg IV q.4  h. p.r.n.  4.  Prednisone 5 mg daily.   MEDICATIONS PRIOR TO ADMISSION:  1.  HCTZ, question dose unknown.  2.  Methotrexate 10 mg q. weekly.  3.  Prednisone 5 mg q.8 h.  4.  __________ 1-2 taken on p.r.n. basis.  5.  Lupron shot every 3-4 months.   PAST MEDICAL HISTORY:  1.  Rheumatoid arthritis was diagnosed when he was in his 40's.  This      disease has been well controlled with present therapy.  2.  He has undergone surgery for left hallux valgus, left second through      fifth hammer toes, left second to fourth metatarsalgia, as well as, left      gastrocnemius in March 2005.  3.  Bilateral cataract surgery in late 1990's.  4.  History of prostate cancer which as diagnosed in 1993.  5.  He has remained in remission under control with hormonal therapy.  6.  Right total hip arthroplasty in December 2001 for worn out joint      secondary to his rheumatoid arthritis.  7.  Cholecystectomy February 2006.  8.  History of anemia, but he is not sure what his hemoglobin usually runs      at.  9.  History of colonic polyps.  Last colonoscopy was less than three years      ago.   ALLERGIES:  NK.   FAMILY HISTORY:  He  has six sisters and a brother living without any major  health issues.  Mother died of lung carcinoma at age 101 and father of auto  accident at age 33.   SOCIAL HISTORY:  He is married.  He has seven children, three daughters and  four sons.  All in fair to good health.  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  He smoked cigarettes for 45 years but quit 10  years ago.  He used to drink alcohol socially but quit in 1993.   PHYSICAL EXAMINATION:  GENERAL APPEARANCE:  Pleasant, well-developed, thin,  Caucasian male who appears to be in no acute distress.  VITAL SIGNS:  Admission weight 125.4 pounds.  He is 6 feet 1 inch tall,  pulse 75, blood pressure 88/41, temperature 100.6, respirations 18.  HEENT:  Conjunctivae is pale.  Sclerae is nonicteric.  Oropharyngeal mucosa  is normal.  He has upper dentures in place.  NECK:  Without masses or thyromegaly.  CARDIAC:  Regular rhythm.  Normal S1, S3.  No murmur or gallop noted.  LUNGS:  Clear to auscultation.  ABDOMEN:  Flat, bowel sounds are normal.  It is soft with mild tenderness at  LLQ.  No organomegaly or masses.  RECTAL:  Deferred.  EXTREMITIES:  Thin with evidence of a particularly small muscle of both  hands.  No clubbing __________ noted.   STUDIES:  Abdominal pelvic CT reviewed with Dr. Ronney Asters.  There is  thickening to rectosigmoid, sigmoid colon, distal descending colon and  questionable thickening to cecal mucosa, which I feel is just the fluid and  nonspecific finding.  He also has abnormality to his left kidney which is  unchanged since previous May 08, 2004.   LABORATORY DATA:  On admission, WBC 8.9, H&H 10.3 and 31.5, platelets  298,000, 91 segs.  Sodium 135, potassium 3.1, chloride 103, CO2 22, glucose  102, BUN 25, creatinine 1.0, bilirubin 0.9, AP 62, AST 19, ALT 11, total  protein 6.1, albumin 2.8, calcium 8.2.  Serum  potassium yesterday was 4.7. Iron B12 folate levels, etc, as above.    ASSESSMENT:  1.  Tharon is a 77 year old Caucasian male who presents with an acute illness      consisting of fever, chills, nausea, nonbloody diarrhea which now has      turned bloody.  His stool studies are negative including C. difficile      toxin titer and fecal leukocytes.  His hemoglobin has dropped by about 1      g, although, his H&H this morning is not available.  His stool was      guaiac negative prior to onset of this bleeding episode.  He has acute      colitis.  I suspect this is an infectious process.  Crohn's disease can      present in similar fashion.  This needs to be mentioned in the      differential diagnosis since he has another autoimmune process in the      form of rheumatoid arthritis.  2.  Acute on chronic anemia.  Serum iron and saturations low.  This is      probably indicative of chronic disease given that serum ferritin is      normal but trialed with iron therapy. When he is discharged, would be      worthwhile.   RECOMMENDATIONS:  1.  Hold ASA for now.  2.  Empiric therapy with Cipro 500 mg b.i.d. and metronidazole 250 mg p.o.      t.i.d.  3.  He will stay on clear liquids, but will allow yogurt with each meal.  4.  Flexible sigmoidoscopy to be performed in the a.m.  I have reviewed the      procedure and risks with the patient and wife and his daughter and they      are all agreeable.  5.  We would like to thank Dr. Sherle Poe for the opportunity to participate      in the care of this gentleman.      Lionel December, M.D.  Electronically Signed     NR/MEDQ  D:  06/03/2005  T:  06/05/2005  Job:  [id number]

## 2010-07-26 NOTE — Group Therapy Note (Signed)
NAME:  [name], [name]                ACCOUNT NO.:  [id number]   MEDICAL RECORD NO.:  [id number]          PATIENT TYPE:  INP   LOCATION:  A204                          FACILITY:  [facility]   PHYSICIAN:  Margaretmary Dys, M.D.DATE OF BIRTH:  12/07/1930   DATE OF PROCEDURE:  06/01/2005  DATE OF DISCHARGE:                                   PROGRESS NOTE   SUBJECTIVE:  The patient feels much better this morning and has not had much  diarrhea.  He denies any more abdominal cramping.  He has no fever or  chills.  He overall feels well.  He has had no nausea or vomiting since  admission.   The patient remains in the hospital because of an abnormal EKG that needs  further workup.   OBJECTIVE:  GENERAL:  The patient is alert, comfortable but not in acute  distress.  VITAL SIGNS:  Blood pressure 115/55, pulse 79, respirations 20, temperature  98.6.  Oxygen saturation is 99% on room air.  HEENT:  Normocephalic, atraumatic.  Oral mucosa moist.  No exudates.  NECK:  Supple.  No JVD.  LUNGS:  Clear clinically with good air entry bilaterally.  HEART:  S1, S2, regular.  No S3, S4, gallops or rubs.  ABDOMEN:  Soft, nontender.  Bowel sounds are positive.  No masses palpable.  EXTREMITIES:  No edema.  CNS:  Exam was grossly intact with no focal deficits.   LABORATORY DATA:  White blood cell count was 5.6, hemoglobin 9.9, hematocrit  29.4.  Platelet count 194.  Neutrophils were 84%.  MCV 89.8.  sodium 135,  potassium 3.1, chloride 106, CO2 21, glucose 91.  BUN 25, creatinine 0.9.  Total bilirubin 0.9, alkaline phosphatase 55, AST 20, ALT 10, total protein  6.1, albumin 2.7, calcium 8.1.  CK-MB is 1.3.  The rest of the cardiac  enzymes were negative.  Stool for occult blood was negative.   Telemetry monitor revealed no incidence of arrhythmias since admission.   ASSESSMENT/PLAN:  1.  Acute viral gastroenteritis.  The patient is much improved.  I think      this is consistent with it being a viral  infection, as this is already      self limiting.  His hydration is improved, and I would decrease his IV      fluids at this time.  The patient has no fever or leukocytosis.  2.  Abnormal electrocardiogram.  This was discussed with the cardiologist      yesterday prior to admission, and he recommended that the patient will      need to be admitted for further cardiology workup.  I requested [facility] to see him in the morning, tomorrow.  His cardiac enzymes are      negative.  He may need a stress test or cardiac catheterization.  I will      defer to cardiology.  3.  Anemia, likely chronic.  Iron studies are pending.  Stool for occult      blood is negative.  A drop in hemoglobin overnight probably is  from      fluid from hemodilution.  4.  A history of weight loss/failure to thrive.  We will continue to watch      him  5.  Syncope.  A CT of his head was negative.  Suspected secondary to fluid      depletion with subsequent orthostasis.  The patient is improved, and he      told me he has been going up to the bathroom without trouble.  DVT      prophylaxis with heparin and GI prophylaxis with Protonix.   DISPOSITION:  Await cardiology input in the a.m. regarding abnormal EKG.  I  think depending on what cardiology says, we may be able to discharge him in  the morning tomorrow.      Margaretmary Dys, M.D.  Electronically Signed     AM/MEDQ  D:  06/01/2005  T:  06/01/2005  Job:  [id number]

## 2010-07-26 NOTE — Procedures (Signed)
NAMEZAKARI, [name]                ACCOUNT NO.:  [id number]   MEDICAL RECORD NO.:  [id number]          PATIENT TYPE:  INP   LOCATION:  A204                          FACILITY:  [facility]   PHYSICIAN:  Vida Roller, M.D.   DATE OF BIRTH:  [birth year]   DATE OF PROCEDURE:  06/02/2005  DATE OF DISCHARGE:                                  ECHOCARDIOGRAM   REFERRING:  Dr. Sherle Poe.   TODAY'S DATE:  June 02, 2005.   TAPE NUMBER:  [id number].   TAPE COUNT:  [id number].   HISTORY:  This is an evaluation for LV systolic function in a patient with  an abnormal electrocardiogram.  Previous echo from July 2004 shows normal LV  systolic function and mild mitral regurgitation.   TECHNICAL QUALITY:  Technical quality of the study is adequate.   M-MODE TRACINGS:  The aorta is 35 mm.   Left atrium is 32 mm.   Septum is 10 mm.   Posterior wall is 10 mm.   Left ventricular diastolic dimension 42 mm.   Left ventricular systolic dimension 29 mm.   2-D AND DOPPLER IMAGING:  The left ventricle is normal size with preserved  LV systolic function.  Estimated ejection fraction 60-65%.  There are no  wall motion abnormalities.   The right ventricle appears to be normal size with normal systolic function.   Both atria are enlarged.   There is increased tricuspid regurgitation velocity at about 3.1 m/sec.  Given the patient's other constellation of findings, I estimate the right  ventricular systolic pressure to be between 50 and 55 mmHg which is moderate  pulmonary hypertension.   The aortic valve is sclerotic.  There is no aortic stenosis.  There is  trivial aortic insufficiency.   The mitral valve is thickened.  There is what appears to be posterior mitral  prolapse with mild to moderate regurgitation and no stenosis is seen.   The tricuspid valve has moderate regurgitation.   There is no pericardial effusion.   The inferior vena cava does appear to be mildly plethoric with normal  respirophasic variation.   ASSESSMENT:  1.  Normal left ventricular systolic function.  2.  Evidence of significant pulmonary hypertension.      Vida Roller, M.D.  Electronically Signed     JH/MEDQ  D:  06/02/2005  T:  06/03/2005  Job:  [id number]   cc:   Margaretmary Dys, M.D.   Ernestina Penna, M.D.  Fax: [phone]

## 2010-07-26 NOTE — Op Note (Signed)
NAME:  [name], [name]                ACCOUNT NO.:  [id number]   MEDICAL RECORD NO.:  [id number]          PATIENT TYPE:  AMB   LOCATION:  DAY                          FACILITY:  [facility]   PHYSICIAN:  Angelia Mould. Derrell Lolling, M.D.DATE OF BIRTH:  10/01/1930   DATE OF PROCEDURE:  04/17/2004  DATE OF DISCHARGE:                                 OPERATIVE REPORT   PREOPERATIVE DIAGNOSIS:  Chronic cholecystitis with cholelithiasis.   POSTOPERATIVE DIAGNOSIS:  Chronic cholecystitis with cholelithiasis.   OPERATION PERFORMED:  Laparoscopic cholecystectomy with intraoperative  cholangiogram.   SURGEON:  Angelia Mould. Derrell Lolling, M.D.   FIRST ASSISTANT:  Rose Phi. Maple Hudson, M.D.   OPERATIVE INDICATIONS:  This is a  77 year old white man who has steroid  dependent rheumatoid arthritis and a history of prostate cancer, being  managed by Dr. Vonita Moss on Lupron shots. He has had a three-month history of  typical episodes of biliary colic. He has had some weight loss. He has had a  moderately extensive workup. The only abnormality was presence of  gallstones. Cholecystectomy was advised and he is brought to operating room  electively.   OPERATIVE FINDINGS:  The gallbladder was chronically inflamed and clearly  discolored but there was no significant wall thickening. His tissues were  very soft and had poor tissue integrity, presumably due to his steroids and  arthritis. The liver, stomach, duodenum, small intestine and large intestine  and peritoneal surfaces otherwise looked normal. There was no ascites. The  cholangiogram showed normal intrahepatic and extrahepatic bile ducts, prompt  flow of contrast to the duodenum, and no filling defect.   OPERATIVE TECHNIQUE:  Following induction of general endotracheal  anesthesia, the patient's head was prepped and draped in sterile fashion.  Marcaine 0/5% with epinephrine was used as local infiltration anesthetic. A  vertically oriented incision was made inside the lower  rim of the umbilicus.  The fascia was incised in the midline. The abdominal cavity entered under  direct vision. A 10 mm Hassan type trocar was inserted and secured with the  pursestring suture of 0 Vicryl. Pneumoperitoneum was created. The video  camera was inserted with visualization and findings as described above. A 10  mm trocar was placed the subxiphoid region two 5 mm trocars placed in the  right mid abdomen. After survey of the abdomen, we identified the  gallbladder retracted the fundus and the infundibulum. A few  adhesions were  taken down. I dissected out the cystic duct and cystic artery. We isolated  the cystic artery as it went onto walled gallbladder, secured with multiple  metal clips and divided it. We then created a large window behind the cystic  duct so that we could get a good critical view of the anatomy. We then  placed a cholangiogram catheter into the cystic duct. We then performed a  cholangiogram using the C-arm. The cholangiogram was normal. The  cholangiogram showed normal intrahepatic and extrahepatic biliary anatomy,  no obstruction, good flow of contrast in the duodenum, and no filling defect  was seen. The cholangiogram catheter was removed. The cystic duct was  secured  with multiple metal clips and divided. The gallbladder was then  dissected from its bed with electrocautery and placed in the specimen bag  and removed. There is a tiny area of bleeding on the gallbladder bed and  medially near where the fundus was that was controlled nicely with  electrocautery. We then irrigated out the subhepatic and subphrenic spaces.  At the completion of the case,  the irrigation fluid was completely clear.  There was no bleeding and no bile leak whatsoever.  The patient's abdomen  was surveyed, there were no other abnormalities. The pneumoperitoneum was  released. The trocars were removed. The fascia at the umbilicus closed with  0 Vicryl sutures. Skin incision closed  with subcuticular sutures of 4-0  Vicryl and Steri-Strips. Clean bandages were placed. The patient taken  recovery in stable condition. Estimated blood loss was about 10 mL.   COMPLICATIONS:  None   Sponge, needle and instrument counts were correct.      HMI/MEDQ  D:  04/17/2004  T:  04/17/2004  Job:  [id number]   cc:   Magnus Sinning. Dimple Casey, M.D.  [address] [place]  [place] [address]  Fax: [phone]

## 2010-07-26 NOTE — Group Therapy Note (Signed)
NAME:  [name], [name]                ACCOUNT NO.:  [id number]   MEDICAL RECORD NO.:  [id number]          PATIENT TYPE:  INP   LOCATION:  A204                          FACILITY:  [facility]   PHYSICIAN:  Margaretmary Dys, M.D.DATE OF BIRTH:  11-28-1930   DATE OF PROCEDURE:  06/03/2005  DATE OF DISCHARGE:                                   PROGRESS NOTE   SUBJECTIVE:  The patient still having diarrhea and actually had some blood  in his stools this morning.  He continues to have abdominal cramping.  He  has had about 4-5 loose bowel movements already this morning, and it was  only 9:00.  He has no fever or chills and denies any lightheadedness.  He  says his abdominal pain is generalized.  He has no nausea or vomiting.   OBJECTIVE:  GENERAL:  The patient is alert, comfortable, in mild pain  distress.  VITAL SIGNS:  Blood pressure 106/47, respiration of 18, temperature was  100.6 degrees F, pulse of 75.  HEENT:  Normocephalic, atraumatic.  Oral mucosa was moist with no exudates.  NECK:  Supple, no JVD, or lymphadenopathy.  LUNGS:  Reduced air entry bilaterally.  No crackles, wheezing, rhonchi was  noted.  ABDOMEN:  The patient has some mild higher umbilical tenderness.  Bowel  sounds were positive.  The abdomen was nondistended.  There was no rebound  tenderness, no guarding.  EXTREMITIES:  No edema.  CNS EXAM:  Grossly intact with no focal deficits.   LABORATORY/DIAGNOSTIC DATA:  White blood cell count was 4.3, hemoglobin 9.2,  hematocrit 28.1, platelet count 213, no left shift.  Sodium 138, potassium  4.7, chloride 111, CO2 21, glucose 123, BUN 21, creatinine 0.7, calcium 8.4,  magnesium 2.2.  Serum iron level was 11, total iron binding capacity 266  with 4% saturation.  Folate, ferritin levels and vitamin B12 levels were  normal.  Urinalysis was unremarkable.  Fecal occult blood was negative.  Stools for C. difficile were negative.  Stool cultures were negative.  There  were no white  blood cells seen and no ova or parasites were seen.  The  patient had some yeast cells.   ASSESSMENT AND PLAN:  1.  Acute gastroenteritis, unclear etiology.  Now patient has some blood in      stools.  Considering possible ischemic colitis or  bacillary dysentery.      The patient continues to have some crampy abdominal pain.  I will      proceed with obtaining a CT scan of his abdomen and pelvis.  I will also      request gastroenterology to see him.  He is not in acute renal failure,      and he is hemodynamically stable.  2.  Abnormal EKG.  I discussed the echocardiogram report with cardiologist      yesterday.  The patient does have some left ventricular hypertrophy, but      his ejection fraction was unremarkable.  The patient will need a stress      test.  I will try to organize a stress thallium  test once he is more      stable.  This may be done as an outpatient.  3.  Rheumatoid arthritis.  The patient is stable with no complaints.  Pain      is well controlled.   DISPOSITION:  CT scan abdomen and pelvis to further evaluate evolving  abdominal illness.  Stools to be monitored for blood.  We will monitor H&H  for any evidence of worsening anemia indicative of ongoing blood loss.  Gastroenterology consult pending.  Stress nuclear test as outpatient or even  during this hospitalization if stable.      Margaretmary Dys, M.D.  Electronically Signed     AM/MEDQ  D:  06/03/2005  T:  06/03/2005  Job:  [id number]

## 2010-07-26 NOTE — Group Therapy Note (Signed)
NAME:  [name], [name]                ACCOUNT NO.:  [id number]   MEDICAL RECORD NO.:  [id number]          PATIENT TYPE:  INP   LOCATION:  A204                          FACILITY:  [facility]   PHYSICIAN:  Margaretmary Dys, M.D.DATE OF BIRTH:  03-21-1930   DATE OF PROCEDURE:  06/02/2005  DATE OF DISCHARGE:                                   PROGRESS NOTE   SUBJECTIVE:  The patient is feeling better, still has some diarrhea but his  stools are better formed.  His appetite is excellent, and he is eating  better.  He has no nausea or vomiting.  He denies any abdominal pain.  No  more lightheadedness or syncopal attacks since admission.  The patient has  had an echocardiogram today, the results of which are pending.   OBJECTIVE:  GENERAL:  Conscious, alert.  Comfortable, not in acute distress.  Pleasant.  VITAL SIGNS:  Blood pressure 102/60, pulse 76, respiratory rate 20,  temperature 97.9 degrees Fahrenheit.  HEENT EXAM:  Normocephalic, atraumatic.  Oral mucosa moist.  No exudates.  NECK:  Supple.  No JVD or lymphadenopathy.  LUNGS:  Clear clinically with good air entry bilaterally.  HEART:  S1, S2.  Regular.  No S3, S4, gallops, or rubs.  ABDOMEN:  Soft, nontender.  Bowel sounds positive.  No mass palpable.  EXTREMITIES:  No edema.  CNS EXAM:  Grossly intact with no focal deficits.   LABORATORY/DIAGNOSTIC DATA:  White blood cell count 4.3, hemoglobin 9.2,  hematocrit 28.1, platelet count 213 with no left shift.  Magnesium 2.2,  calcium 8.4.  Basic metabolic panel was normal with improvement in potassium  to 4.7 from 3.1.  BUN 21, creatinine 0.7.   ASSESSMENT AND PLAN:  1.  Acute viral gastroenteritis.  This continues to be self-limiting.  The      patient is improving daily.  Hydration status is improved, and he is      tolerating p.o.  His blood pressure is also satisfactory.  2.  Abdominal electrocardiogram.  The patient is awaiting results of      echocardiogram done by Cardiology today.   We will discuss the results      with the patient once available.  Plan is probably a stress test or even      cardiac catheterization, will defer to Cardiology.  3.  Rheumatoid arthritis.  The patient is doing fairly well and stable, no      pain.   DISPOSITION:  Likely home in the a.m. or as per Cardiology.  Await echo  report.      Margaretmary Dys, M.D.  Electronically Signed    AM/MEDQ  D:  06/02/2005  T:  06/02/2005  Job:  [id number]

## 2010-07-26 NOTE — Consult Note (Signed)
NAMECARY, [name]                ACCOUNT NO.:  [id number]   MEDICAL RECORD NO.:  [id number]          PATIENT TYPE:  INP   LOCATION:  A204                          FACILITY:  [facility]   PHYSICIAN:  [name], M.D.   DATE OF BIRTH:  [birth year]   DATE OF CONSULTATION:  06/02/2005  DATE OF DISCHARGE:                                   CONSULTATION   PRIMARY CARE PHYSICIAN:  [name], M.D.   HISTORY OF PRESENT ILLNESS:  [name] is a very pleasant 77 year old man  who was admitted on June 01, 2005, complaining of some viral  gastroenteritis with nausea, vomiting and episode of syncope.  He had an  electrocardiogram done which was abnormal, and we were asked to see him.  He  denies any chest pain, but had bilateral shoulder pain and some dyspnea on  exertion over the last four weeks.  He is a relatively active guy, cleans  doctor's offices for a living.  He noticed this started about a month ago.  He got ill, started having nausea and vomiting, and then had this syncopal  episode.  He was brought to the hospital and evaluated.  He has a history of  rheumatoid arthritis.  He is status post a right hip replacement in 2001.  He has prostate cancer, originally diagnosed in 1993, which I think is  quiescent.  He has had a cholecystectomy in July 2006 and has had foot  surgery a few years ago.  His only cardiac evaluation was an echocardiogram  back in 2004 which showed normal LV systolic function and mild MR, and I am  not sure why that was done.   CURRENT MEDICATIONS:  1.  Aspirin 81 mg once daily.  2.  Protonix 40 mg IV daily.  3.  Prednisone 5 mg daily.  4.  IV normal saline at 50 cc an hour.  5.  Methotrexate 2.5 mg a week.  6.  Hydroxychloroquine 200 mg p.r.n., but has not had any of that in the      hospital.  7.  Lupron injections q.4 months.   SOCIAL HISTORY:  He lives in [place] with his wife.  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  He is married and he has a son who  had a heart attack at  age 30.  He used to smoke but he quit about 12 years ago.  Probably has  about a 45-50 pack year smoking history.  No alcohol or illicit drugs.   FAMILY HISTORY:  Impressive.  Mother died of lung cancer at age 42.  Father  died of a motor vehicle accident, however, did have heart disease in his  29's.  The interesting thing is that he has 8 siblings, all of whom are in  their 70's or 80's and all of whom are healthy.   REVIEW OF SYSTEMS:  Fevers, chills, nausea and vomiting.  Denies any  headaches or visual changes.  No nasal discharge or difficulty swallowing.  No chest pain, shortness of breath, PND or orthopnea.  No lower extremity  edema.  Did  have the episode of syncope which sounds relatively orthostatic  associated with a GI illness.  No claudication.  No cough or wheezing.  No  urinary frequency or dysuria.  Does have occasional nocturia.  No weakness  or numbness.  No arthralgias other than this shoulder discomfort.  He did  have nausea, vomiting and diarrhea as previously stated, and the remained of  his review of systems is negative.   PHYSICAL EXAMINATION:  GENERAL APPEARANCE:  A thin, white male in no  apparent distress.  Alert and oriented x4.  VITAL SIGNS:  Pulse 55, respirations 20, blood pressure 102/60.  He is tilt  negative.  HEENT:  Some mild temporal wasting, but otherwise, unremarkable.  NECK:  Supple with no jugular venous distention or carotid bruits.  CARDIOVASCULAR:  Regular with 1/6 holosystolic murmur heard best at the  apex.  LUNGS:  Clear to auscultation.  ABDOMEN:  Soft, nontender.  No skin rashes.  GU/BREAST/RECTAL:  Deferred.  EXTREMITIES:  Without cyanosis, clubbing or edema.  Pulses are 1+.  There  are no bruits.  MUSCULOSKELETAL/NEUROLOGICAL:  Grossly nonfocal.   STUDIES:  Head CT shows bifrontal extraocular simple fluid which is probably  secondary to chronic atrophy as opposed to subdural hematoma.  No other   intracranial processes.  He has severe COPD on his chest x-ray.  Electrocardiogram shows sinus rhythm at a rate of 77 with normal axis,  normal intervals.  He has inverted T waves in the inferior leads as well as  across the anterior precordium.   LABORATORY DATA:  Total cholesterol 116, triglycerides 97, HDL 34, LDL 63,  LFT's are within normal limits.  White blood cell count 4.3, H&H 9 and 28  with a platelet count of 213,000.  Sodium 138, potassium 4.7, chloride 111,  bicarbonate 21, BUN 21, creatinine 0.7, blood sugar 123.  Three sets of  cardiac enzymes are negative.  His TSH is normal.  His iron is down,  consistent with an iron deficient anemia.  His urinalysis shows some white  blood cells, red blood cells, but otherwise, is unremarkable.  C. difficile  toxin is negative.   IMPRESSION:  This is a gentleman with an abnormal electrocardiogram.  It is  unclear of the etiology.  He has multiple reasons for this including some  electrolyte abnormalities when he first presented.  Certainly, the COPD  could also be a cause.  We are going to sort of focus on potential cardiac  problems.  He does have a history of mitral regurgitation, so this is also a  possibility.  His shoulder discomfort is interesting.  It does not sound  classic for angina, but certainly could be.  He has the COPD also that could  be potentially causing his shortness of breath.  We are going to need to  pursue this as well.  I think it is probably reasonable, at this point, to  get an echocardiogram and assess his LV systolic function as well as the  mitral valve.  If this all looks good, I think we can probably send him home  and complete the workup as an outpatient.  He is going to need an exercise  perfusion study or a hard cath depending on the results of the  echocardiogram, and we will go into that in more detail with him and make  further plans once the date is set and matures.     [name], M.D.   Electronically Signed     JH/MEDQ  D:  06/02/2005  T:  06/03/2005  Job:  [id number]   cc:   [name], M.D.  Fax: [phone]

## 2010-07-26 NOTE — Op Note (Signed)
[facility]  Patient:    [name], [name]                         MRN: [id number] Proc. Date: 02/26/00 Adm. Date:  [id number] Attending:  Jacki Cones                           Operative Report  PREOPERATIVE DIAGNOSIS:  Right hip rheumatoid arthritis.  POSTOPERATIVE DIAGNOSIS:  Right hip rheumatoid arthritis.  PROCEDURE:  Right total hip arthroplasty, cement femur, press-fit acetabulum.  SURGEON:  Mark C. Ophelia Charter, M.D.  ASSISTANT:  Colon Flattery. Ollen Bowl, M.D.  ANESTHESIA:  GOT.  ESTIMATED BLOOD LOSS:  450 ml.  DESCRIPTION OF PROCEDURE:  After induction of general anesthesia, with preoperative Ancef prophylactically given in the holding area, the patient was placed in the lateral position with axillary roll using the Mark VII frame and the usual extremity sheets, drapes, total hip Betadine Vi-Drapes, impervious stockinette, Coban, and total hip drapes.  Sterile skin marker was used prior to the Betadine Vi-Drape.  Posterior approach was made.  Subcutaneous tissue was sharply dissected with the Bovie electrocautery.  A Weitlaner was placed.  The tensor fascia was split in line with its fibers.  Gluteus maximum was split in line with its fibers with finger and thumb dissection.  The area was palpated, Charnley retractor was placed, preserving the nerve.  Posterior external rotators were divided directly off the femur, going up to the piriformis, which was tagged prior to dividing it.  The capsule was separated from the external rotators with Cobb, and then a posterior capsulectomy was performed, removing some hypertrophic rheumatoid synovium, which was sent for pathology.  Hip was dislocated.  There was destructive wear of the hip with marginal erosion and more significant marginal erosion on the acetabular side with secondary spurs.  Neck was cut in line with the guide, canal starter reamer, trochanteric reamer, and sequential reaming was  performed up to #7 size, and a #6 broach was tight against the calcar with minimal cancellous bone, and it was obvious that a #7 broach would not fit in the canal.  A distal cement restrictor was placed, #5, and pulsatile irrigation was used followed by placing a dry sponge.  Attention was turned to the acetabulum. Labrum was removed.  Marginal osteophytes were removed.  Sequential reaming up to a 56 reamer was used, 56 PSA Osteonics SecureFit screwless acetabular shell was selected, impacted, central hole plugs, and then trials were used with the #5 neck being the appropriate length, restoring the patients preoperative length measured between a large Steinmann pin in the pelvis and a drill bit in the greater trochanter.  This reproduced the patients 60 mm gap.  After pulsatile irrigation, cement was pressure-mixed, impacted, and the permanent #6 cement ODC Plus stem was impacted with the collar held flush against the calcar until the cement was hardened at 15 minutes.  Trunion was cleaned, and +5 ball was impacted.  Poly shell had been placed with the lip posterior superior for excellent stability.  There was _____ shuck, flexion at 90 degrees, central rotation 80 degrees, full extension, no trochanteric impingement, external rotation at full extension was stable, and the knee would flex past 90 degrees with the hip in full extension.  Piriformis was repaired to the gluteus medius.  Tensor fascia was closed with 0 Ethibond suture, 7-0 Vicryl in the gluteus maximus fascia, 2-0  Vicryl in the subcutaneous tissue, and skin staple closure on the skin after Marcaine skin infiltration for postoperative analgesia.  Postoperative dressing and knee immobilizer were applied.  A Foley catheter was placed at the beginning of the case and was left in for postoperative decompression of the bladder. Instrument count, needle count was correct. DD:  02/26/00 TD:  02/27/00 Job: [id number]

## 2010-07-26 NOTE — Discharge Summary (Signed)
[name], REWERTS                ACCOUNT NO.:  [id number]   MEDICAL RECORD NO.:  [id number]          PATIENT TYPE:  INP   LOCATION:  A204                          FACILITY:  [facility]   PHYSICIAN:  Osvaldo Shipper, MD     DATE OF BIRTH:  Jul 22, 1930   DATE OF ADMISSION:  05/31/2005  DATE OF DISCHARGE:  03/30/2007LH                                 DISCHARGE SUMMARY   PRIMARY CARE PHYSICIAN:  Ernestina Penna, M.D.   DISCHARGE DIAGNOSES:  1.  Acute infectious colitis with salmonella, improving.  2.  Anemia requiring further workup.  3.  Abnormal electrocardiogram requiring followup with cardiology.  4.  History of rheumatoid arthritis, stable.   HISTORY OF PRESENT ILLNESS:  Please review my H&P dictated at the time of  admission for details regarding the patient's presenting illness.   HOSPITAL COURSE:  Problem 1.  Colitis.  This is a 77 year old Caucasian male  who has a history of rheumatoid arthritis who presented to the ED with  diarrhea.  Initially, it was thought that the patient had viral  gastroenteritis.  He was admitted to the hospital, as he was dehydrated for  IV hydration.  However, the patient started getting blood in his stool  during this hospital stay.  His symptoms did not improve with conservative  management.  Subsequently, a CAT scan of the abdomen and pelvis was obtained  which showed acute colitis of the left lower colon, sigmoid colon, and  rectum.  Questionable colitis seen in the region of the cecum as well.  Other findings noted include stable complex cyst in the left kidney.  Based  on the above findings, gastroenterology was consulted who started the  patient on Cipro and Flagyl.  The patient was subsequently prepped for a  flexible sigmoidoscopy which was done which showed findings consistent with  colitis.  This was most likely thought to be infectious in etiology.  The  patient continued to show improvement once he was started on the  antibiotics.  His  stools became more and more formed.  He stopped having any  blood in his stool as well.  Stool culture subsequently came back growing  salmonella.  It has not been specifically identified as yet.  Flagyl was  subsequently discontinued.  The patient will continue on Cipro for eight  more days at home.  Repeat stool cultures will need to be done in six weeks  to ensure that he is not a carrier.   Problem 2.  Abnormal EKG.  On admission, the patient was found to have  significantly abnormal EKG compared with an EKG from one year ago.  He was  specifically found to have ST depression and T wave inversion in the  inferior leads.  To further stratify, we obtained an echocardiogram which  actually showed normal left systolic function with evidence for significant  pulmonary hypertension.  Cardiology was consulted who recommended that the  patient have an outpatient either stress test or catheterization which will  be determined when he goes to them for followup.  The patient was on aspirin  at home, but because of his colitis and his hematochezia, this was  discontinued.  The patient will be asked to stay off of aspirin at least  until he is seen by [facility].   Problem 3.  Anemia.  The patient was found to have a hemoglobin of 10.3 on  admission which has gone down to 8.7, possibly because of the blood loss in  his stool.  It has stayed stable in that range for the past two days.  The  patient's iron profile study showed possible iron deficiency anemia for  which he will be started on ferrous sulfate.  He will possibly need a full  total colonic study  once his GI issues and his cardiac issues have settled  down.  This will be deferred to PMD to schedule at a later date.   His other medical issues, including rheumatoid arthritis, remained stable.  He has been doing reasonably well the past few days as well and has been  keen on going home.   DISCHARGE MEDICATIONS:  1.  Cipro 500 mg  b.i.d. for eight days.  2.  Protonix 40 mg once daily.  3.  Ferrous sulfate 325 mg twice daily.  4.  Patient asked to discontinued aspirin until seen by cardiology and after      his CBC is done in about two weeks' time to show stability in his      hemoglobin.  5.  He has been asked to resume his other medications as before.   FOLLOW UP:  1.  The patient is to follow up with Dr. Dorethea Clan or Dietrich Pates in about two      weeks' time to follow up on the abnormal EKG.  2.  The patient is to follow up with his PMD in about four weeks' time.   OTHER FOLLOW UP:  1.  Stool C/S in six weeks.  2.  CBC in one week.   DIET:  The patient may have a low residue cardiac diet.   ACTIVITY:  No restrictions placed on his activity level.   IMAGING STUDIES:  1.  CAT scan of the abdomen and pelvis, as discussed above.  2.  CAT scan of the head which did not show any acute findings.  3.  Chest x-ray which showed severe emphysema.  4.  2-D echocardiogram, as discussed above.   CONSULTATIONS:  1.  Gastroenterology, Dr. Karilyn Cota and Dr. Jena Gauss.  2.  Cardiology, Dr. Dorethea Clan.      Osvaldo Shipper, MD  Electronically Signed     GK/MEDQ  D:  06/06/2005  T:  06/06/2005  Job:  [id number]   cc:   Ernestina Penna, M.D.  Fax: [phone]   Lionel December, M.D.  [address]  [place]  [place] [address]   Vida Roller, M.D.  Fax: [phone]

## 2010-07-26 NOTE — Procedures (Signed)
   NAME:  VALEN, GILLISON                          ACCOUNT NO.:  [id number]   MEDICAL RECORD NO.:  [id number]                   PATIENT TYPE:  OUT   LOCATION:  RAD                                  FACILITY:  [facility]   PHYSICIAN:  Thomas C. Wall, M.D.                DATE OF BIRTH:  05-Mar-1931   DATE OF PROCEDURE:  10/05/2002  DATE OF DISCHARGE:                                  ECHOCARDIOGRAM   Echo is for a murmur.  75.2   The echocardiogram was technically difficult.   CONCLUSIONS:  1. Normal left ventricular size.  2. Normal left ventricular chamber size and overall systolic function.  3. Mild mitral regurgitation with question of anterior leaflet mitral valve     prolapse.  4. Right-sided structures.  5. No pericardial effusion.                                               Thomas C. Wall, M.D.    TCW/MEDQ  D:  10/05/2002  T:  10/05/2002  Job:  [id number]   cc:   Colon Flattery, MD  [address]  [place]  [place] [address]  Fax: [phone]

## 2010-07-26 NOTE — Op Note (Signed)
NAME:  [name], MONTMINY                          ACCOUNT NO.:  [id number]   MEDICAL RECORD NO.:  [id number]                   PATIENT TYPE:  AMB   LOCATION:  DSC                                  FACILITY:  [facility]   PHYSICIAN:  Leonides Grills, M.D.                  DATE OF BIRTH:  January 26, 1931   DATE OF PROCEDURE:  05/30/2003  DATE OF DISCHARGE:  05/30/2003                                 OPERATIVE REPORT   PREOPERATIVE DIAGNOSES:  1. Left hallux valgus.  2. Left second through fifth hammer toes.  3. Left second through fourth metatarsalgia.  4. Left fifth metatarsalgia.  5. Left tight gastrocnemius.   POSTOPERATIVE DIAGNOSES:  1. Left hallux valgus.  2. Left second through fifth hammer toes.  3. Left second through fourth metatarsalgia.  4. Left fifth metatarsalgia.  5. Left tight gastrocnemius.   OPERATION:  1. Left Chevron bunionectomy.  2. Left second through fourth metatarsal head resection.  3. Left fifth metatarsal head resection.  4. Left second through fifth toes metatarsophalangeal joint dorsal     capsulotomy with collateral release.  5. Left second through fifth toes, proximal phalanx head resection.  6. Left second through fifth toes, extensor digitorum brevis to extensor     digitorum longus transfers.  7. Left gastrocnemius slide.  8. Stress x-rays, left foot.   ANESTHESIA:  General endotracheal tube with popliteal block.   SURGEON:  Leonides Grills, M.D.   ASSISTANT:  Lianne Cure, P.A.   ESTIMATED BLOOD LOSS:  Minimal.   TOURNIQUET TIME:  Approximately an hour and a half.   COMPLICATIONS:  None.   DISPOSITION:  Stable to the PR.   INDICATION:  This is a 77 year old male who has had longstanding left  forefoot pain secondary to rheumatoid arthritis and the above deformities.  He was consented for the above procedure.  All risks, which include  infection, neurovascular injury, nonunion, malunion, heart failure,  persistent pain, worsening pain, stiffness,  arthritis and possible fusion of  the great toe were all explained.  Questions were encouraged and answered.   OPERATION:  The patient was brought to the operating room and placed in the  supine position, after adequate general endotracheal anesthesia was  administered, as well as Ancef one gram IV piggyback.  The left lower  extremity was then prepped and draped in the sterile manner and placed a  thigh tourniquet.  Procedure commenced with a longitudinal incision on the  medial gastrocnemius muscle tendinous junction.  Dissection was carried down  through skin.  Hemostasis was obtained.  The fascia was opened in line with  the incision, conjoined region between gastroc-soleus was then developed.  Posterior soft tissues then elevated off the posterior aspect of the  gastrocnemius tendon and sural nerve was identified, protected posteriorly.  The gastrocnemius then released with the Mayo scissors.  This had an  excellent release.  Digastric wound was  copiously irrigated with normal  saline. The subcu was closed with 3-0 Vicryl.  The skin was closed with 4-0  Monocryl subcuticular stitch.  Steri-Strips were applied.   The limb was gravity exsanguinated.  The tourniquet was elevated to 290  mmHg.  A longitudinal incision over the second toe was then made.  Dissection was carried down through skin, extensor digitorum brevis and  longus tendons were identified.  The extensor digitorum brevis was  tenotomized proximal medial and the brevis distal lateral, and retracted out  of harm's way for later transfer.  MTP joint, dorsal capsulotomy was then  performed with collateral release with the 15 blade scalpel.  The PIP joint  was then entered and the distal aspect of the proximal phalanx was  skeletonized.  The head was then removed with the rongeur, followed by a  bone cutter.  We then went back into the MTP joint and with McClamary type  elevator, the joint was then entered and the head was  then removed with the  sagittal saw in an oblique manner.  Once this was removed with the rongeur,  we then placed a 0.045 double ended trocar K wire antegrade through the  middle and distal phalanx, and then retrograde into the proximal phalanx  with the PIP joint reduced.  We then placed the K wire into the metatarsal  shaft with the toe held in reduced position.  The EDP was then transferred  to the EDL dorsally with 3-0 PDS suture.  The same exact procedure was  performed to the third, fourth and fifth toes, respectively, using separate  incisions as well.   We then made a longitudinal incision on the medial aspect of the left great  toe MTP joint, dissection was carried down through skin and neurovascular  structure identified superiorly and inferiorly and protected throughout the  case.  L-shaped capsulotomy was then performed.  Simple bunionectomy was  performed with the sagittal saw.  Lateral capsule was then released with a  curved Beaver blade.  The center of the head was then identified and soft  tissue elevated superiorly and inferiorly.  Chevron osteotomy was then  performed.  The head was then translated approximately 3-4 mm laterally.  This was then fixed with a 2.0 mm pull threaded cortical set screw using 1.5  mm drill hole, respectively.  This had excellent purchase and maintenance of  correction.  Redundant  bone medially was then removed with the sagittal  saw.  __________ ridge was then rounded off with a rongeur.  The joint was  then copiously irrigated with normal saline.  The capsule was then advanced,  both superiorly and proximally, and repaired with 2-0 Vicryl suture.  The  tourniquet was deflated.  All toes pinked up nicely.  All wounds were  copiously irrigated with normal saline.  Skin relieving incisions were made  over each K wire with 11 blade scalpel.  The K wires were then cut and capped.  All wounds were closed with 4-0 nylon suture.  Sterile dressing  was  applied, __________  dressing was applied.  Cam walker boot was applied.  The patient was stable to PAR.                                               Leonides Grills, M.D.    PB/MEDQ  D:  05/30/2003  T:  06/01/2003  Job:  [id number]

## 2010-07-26 NOTE — Op Note (Signed)
NAME:  [name], [name]                          ACCOUNT NO.:  [id number]   MEDICAL RECORD NO.:  [id number]                   PATIENT TYPE:  AMB   LOCATION:  DSC                                  FACILITY:  [facility]   PHYSICIAN:  Leonides Grills, M.D.                  DATE OF BIRTH:  1931-02-01   DATE OF PROCEDURE:  05/30/2003  DATE OF DISCHARGE:                                 OPERATIVE REPORT   ADDENDUM TO THE PROCEDURE:  Once fixation was in place, as well as K wires,  stress x-rays were obtained that showed stable fixation with no evidence of  motion at the osteotomy site.  Screw was in proper position and the  sesamoids were in proper position as well.  All K wires were in the proper  alignment, as well as toes.                                               Leonides Grills, M.D.    PB/MEDQ  D:  05/30/2003  T:  05/31/2003  Job:  [id number]

## 2010-07-26 NOTE — Op Note (Signed)
[name], [name]                ACCOUNT NO.:  [id number]   MEDICAL RECORD NO.:  [id number]          PATIENT TYPE:  INP   LOCATION:  A204                          FACILITY:  [facility]   PHYSICIAN:  Lionel December, M.D.    DATE OF BIRTH:  [birth year]   DATE OF PROCEDURE:  06/04/2005  DATE OF DISCHARGE:                                 OPERATIVE REPORT   PROCEDURE:  Flexible sigmoidoscopy.   INDICATIONS:  [name] is a 77 year old Caucasian male who was admitted with  nausea, vomiting, abdominal pain and nonbloody diarrhea and felt to have  gastroenteritis.  However, he is not having bloody diarrhea.  He had  abdominal CT yesterday which revealed thickened rectosigmoid and sigmoid  colon.  All his stool studies been negative.  He is undergoing diagnostic  flexible sigmoidoscopy.  Procedure and risks were reviewed with the patient,  and informed consent was obtained.   FINDINGS:  Procedure performed in endoscopy suite.  The patient's vital  signs were monitored during procedure and remained stable.  He was placed in  left lateral position, and rectal examination performed.  No abnormality  noted on external or digital exam.  Olympus videoscope was placed in the  rectum where there was patchy erythema with friability and erosions.  Scope  was passed into sigmoid colon to about 35 cm, and similar patchy changes  were noted but more pronounced than in the rectum.  Few small diverticula  were also noted at sigmoid colon.  Biopsy was taken from the sigmoid colon  for routine histology.  Endoscope was withdrawn.  The patient tolerated the  procedure well.   FINAL DIAGNOSIS:  1.  Patchy colitis involving sigmoid colon and rectum with mucosal erythema,      friability and erosions.  Suspect this is an infectious process.  2.  Sigmoid colon diverticulosis.   RECOMMENDATIONS:  1.  Will continue Cipro and metronidazole as before.  2.  Full liquid diet with yogurt with each meal.  3.  We will follow-up  on biopsy results which should be back in two days.      Lionel December, M.D.  Electronically Signed    NR/MEDQ  D:  06/04/2005  T:  06/06/2005  Job:  [id number]   cc:   Ernestina Penna, M.D.  Fax: [phone]

## 2010-07-26 NOTE — Op Note (Signed)
[name], [name]                ACCOUNT NO.:  [id number]   MEDICAL RECORD NO.:  [id number]          PATIENT TYPE:  EMS   LOCATION:  ED                           FACILITY:  [facility]   PHYSICIAN:  Burnard Bunting, M.D.    DATE OF BIRTH:  06-30-1930   DATE OF PROCEDURE:  06/28/2005  DATE OF DISCHARGE:  06/28/2005                                 OPERATIVE REPORT   PREOPERATIVE DIAGNOSIS:  Right total hip posterior dislocation.   POSTOPERATIVE DIAGNOSIS:  Right total hip posterior dislocation.   PROCEDURE:  Right total hip arthroplasty, closed reduction.   SURGEON:  Dr. Dorene Grebe   ANESTHESIA:  General.   ESTIMATED BLOOD LOSS:  None.   PROCEDURE IN DETAIL:  The patient brought to the operating room where a  general endotracheal anesthesia was induced.  The patient was placed on the  fluoroscopy table.  Using a combination of hip flexion, adduction, and  traction, the hip was reduced.  Reduction was confirmed using fluoroscopic  guidance.  The hip was relatively stable through a range of motion.  The  femur was not fractured.  The patient was placed in a padded knee  immobilizer and transferred to the recovery room in stable condition.           ______________________________  G. Dorene Grebe, M.D.     GSD/MEDQ  D:  06/28/2005  T:  06/30/2005  Job:  [id number]

## 2010-07-26 NOTE — Op Note (Signed)
NAMEVINH, SACHS                ACCOUNT NO.:  [id number]   MEDICAL RECORD NO.:  [id number]          PATIENT TYPE:  OBV   LOCATION:  0102                         FACILITY:  [facility]   PHYSICIAN:  Mark C. Ophelia Charter, M.D.    DATE OF BIRTH:  12-22-1930   DATE OF PROCEDURE:  09/10/2004  DATE OF DISCHARGE:                                 OPERATIVE REPORT   PREOPERATIVE DIAGNOSIS:  Right total hip arthroplasty, closed hip, posterior  dislocation.   PROCEDURE:  Closed reduction under general anesthesia.   SURGEON:  Mark C. Ophelia Charter, M.D.   ANESTHESIA:  General.   This 77 year old male with some quad  weakness from his rheumatoid arthritis  and bad knees.  Flexed at the waist and hips.  Bent over to the floor and  twisted to the right to pick something off the floor with his knee extended  and with his chest down close to his knee and suddenly suffered a posterior  hip dislocation.  X-rays demonstrated the hip dislocation.  He had a  cemented femoral stem and Press-Fit acetabulum.  He was taken to the  operating room, and general anesthesia was induced.  His tube is being taped  in with counter-pressure by the circulating nurse on each anterior superior  iliac spine.  Climbed on the bed.  Flexed his hip to 90 degrees and knee to  90 degrees. With distraction, popped his hip in with  leg lengths equal.  Knee immobilizer is applied.  The x-ray confirmed appropriate reduction.  Transferred to the recovery room in stable condition.       MCY/MEDQ  D:  09/10/2004  T:  09/10/2004  Job:  [id number]

## 2010-10-17 ENCOUNTER — Encounter: Payer: Self-pay | Admitting: Cardiology

## 2010-12-02 ENCOUNTER — Telehealth: Payer: Self-pay | Admitting: Gastroenterology

## 2010-12-02 NOTE — Telephone Encounter (Signed)
Received a medical records release to release records to Dr. Danise Edge.

## 2011-02-14 IMAGING — CR DG CHEST 1V
2 series · 2 of 2 positions shown · non-contrast
Comparison: Chest 12/18/2005.

CLINICAL DATA: Right hip pain.  Hip dislocation.

CHEST - 1 VIEW

[t chest supine (1 of 2)]
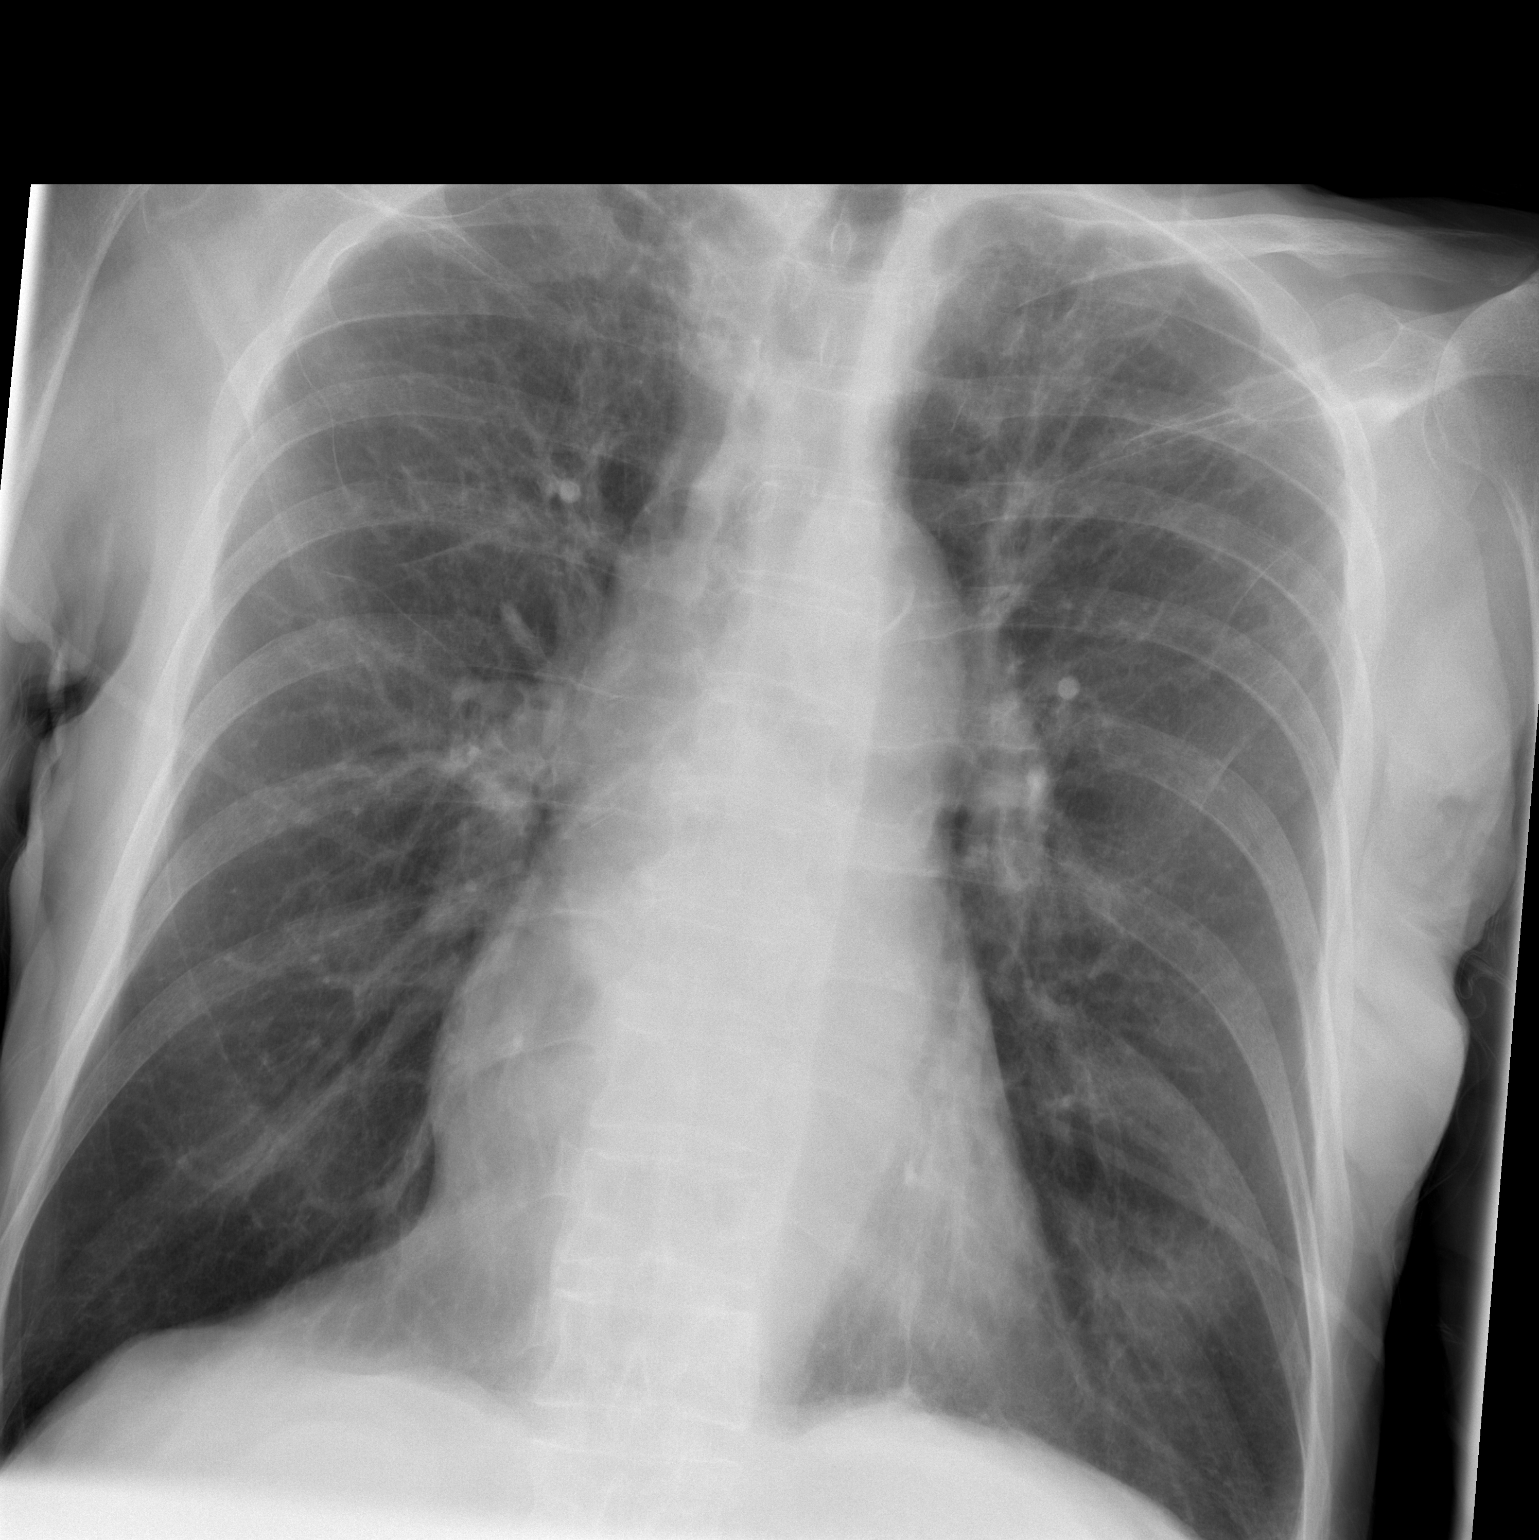

[t chest supine (2 of 2)]
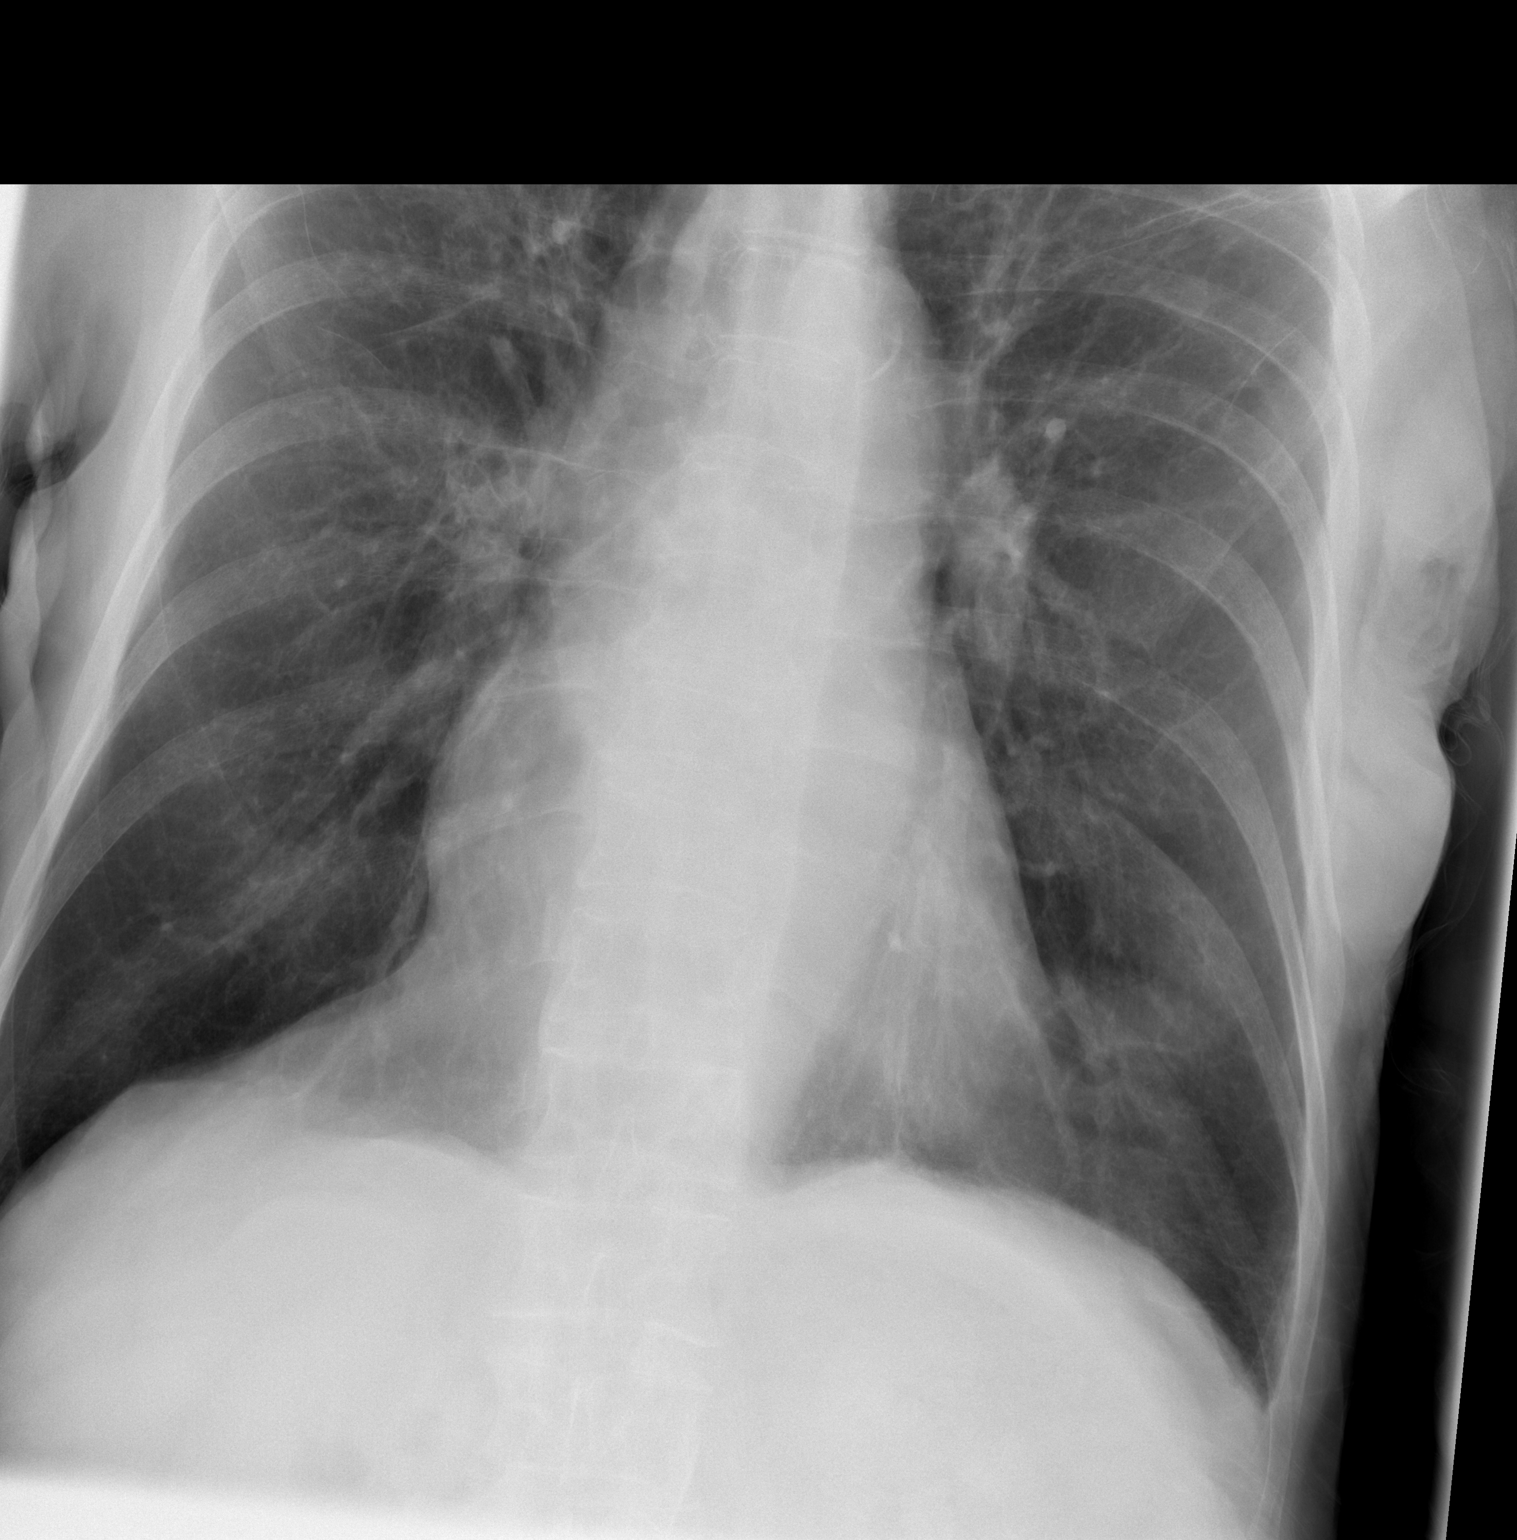

[2 of 2 positions shown; findings below may reference images not displayed]

FINDINGS: The lungs are markedly emphysematous but clear.  There is
mild cardiomegaly.  No effusion.
IMPRESSION: Severe emphysema without acute disease.

## 2011-02-23 ENCOUNTER — Emergency Department (INDEPENDENT_AMBULATORY_CARE_PROVIDER_SITE_OTHER): Payer: Medicare Other

## 2011-02-23 ENCOUNTER — Emergency Department (HOSPITAL_BASED_OUTPATIENT_CLINIC_OR_DEPARTMENT_OTHER)
Admission: EM | Admit: 2011-02-23 | Discharge: 2011-02-23 | Disposition: A | Discharge status: Home / Self Care | Payer: Medicare Other | Attending: Emergency Medicine | Admitting: Emergency Medicine

## 2011-02-23 ENCOUNTER — Encounter (HOSPITAL_BASED_OUTPATIENT_CLINIC_OR_DEPARTMENT_OTHER): Payer: Self-pay | Admitting: Emergency Medicine

## 2011-02-23 DIAGNOSIS — K5289 Other specified noninfective gastroenteritis and colitis: Secondary | ICD-10-CM | POA: Insufficient documentation

## 2011-02-23 DIAGNOSIS — E785 Hyperlipidemia, unspecified: Secondary | ICD-10-CM | POA: Insufficient documentation

## 2011-02-23 DIAGNOSIS — R197 Diarrhea, unspecified: Secondary | ICD-10-CM | POA: Insufficient documentation

## 2011-02-23 DIAGNOSIS — K529 Noninfective gastroenteritis and colitis, unspecified: Secondary | ICD-10-CM

## 2011-02-23 DIAGNOSIS — J449 Chronic obstructive pulmonary disease, unspecified: Secondary | ICD-10-CM | POA: Insufficient documentation

## 2011-02-23 DIAGNOSIS — K7689 Other specified diseases of liver: Secondary | ICD-10-CM

## 2011-02-23 DIAGNOSIS — Z8739 Personal history of other diseases of the musculoskeletal system and connective tissue: Secondary | ICD-10-CM | POA: Insufficient documentation

## 2011-02-23 DIAGNOSIS — J4489 Other specified chronic obstructive pulmonary disease: Secondary | ICD-10-CM | POA: Insufficient documentation

## 2011-02-23 DIAGNOSIS — Z79899 Other long term (current) drug therapy: Secondary | ICD-10-CM | POA: Insufficient documentation

## 2011-02-23 LAB — DIFFERENTIAL
Eosinophils Relative: 3 % (ref 0–5)
Lymphocytes Relative: 12 % (ref 12–46)
Lymphs Abs: 1 10*3/uL (ref 0.7–4.0)
Monocytes Absolute: 0.8 10*3/uL (ref 0.1–1.0)

## 2011-02-23 LAB — COMPREHENSIVE METABOLIC PANEL
ALT: 7 U/L (ref 0–53)
AST: 15 U/L (ref 0–37)
Alkaline Phosphatase: 60 U/L (ref 39–117)
CO2: 23 mEq/L (ref 19–32)
Calcium: 9.5 mg/dL (ref 8.4–10.5)
Chloride: 105 mEq/L (ref 96–112)
GFR calc non Af Amer: 83 mL/min — ABNORMAL LOW (ref >90)
Potassium: 3.3 mEq/L — ABNORMAL LOW (ref 3.5–5.1)
Sodium: 140 mEq/L (ref 135–145)
Total Bilirubin: 1.7 mg/dL — ABNORMAL HIGH (ref 0.3–1.2)

## 2011-02-23 LAB — URINE MICROSCOPIC-ADD ON

## 2011-02-23 LAB — CBC
HCT: 38.2 % — ABNORMAL LOW (ref 39.0–52.0)
Hemoglobin: 12.3 g/dL — ABNORMAL LOW (ref 13.0–17.0)
MCV: 95 fL (ref 78.0–100.0)
RBC: 4.02 MIL/uL — ABNORMAL LOW (ref 4.22–5.81)
WBC: 8.3 10*3/uL (ref 4.0–10.5)

## 2011-02-23 LAB — URINALYSIS, ROUTINE W REFLEX MICROSCOPIC
Glucose, UA: NEGATIVE mg/dL
Protein, ur: 30 mg/dL — AB

## 2011-02-23 MED ORDER — SODIUM CHLORIDE 0.9 % IV SOLN
999.0000 mL | Freq: Once | INTRAVENOUS | Status: AC
  Administered 2011-02-23: 1000 mL via INTRAVENOUS

## 2011-02-23 MED ORDER — LOPERAMIDE HCL 2 MG PO CAPS: 2.0000 mg | ORAL_CAPSULE | Freq: Four times a day (QID) | ORAL | Status: AC | PRN

## 2011-02-23 MED ORDER — IOHEXOL 300 MG/ML  SOLN
100.0000 mL | Freq: Once | INTRAMUSCULAR | Status: AC | PRN
  Administered 2011-02-23: 100 mL via INTRAVENOUS

## 2011-02-23 MED ORDER — IOHEXOL 300 MG/ML  SOLN
20.0000 mL | INTRAMUSCULAR | Status: AC
  Administered 2011-02-23 (×2): 20 mL via ORAL

## 2011-02-23 NOTE — ED Provider Notes (Signed)
History     CSN: [id number] Arrival date & time: 02/23/2011 11:03 AM   First MD Initiated Contact with Patient 02/23/11 1117      Chief Complaint  Patient presents with  . Diarrhea    pt reports nausea  and diarrhea with dizzines after BM no LOC    (Consider location/radiation/quality/duration/timing/severity/associated sxs/prior treatment) HPI Patient presents with diarrhea. He notes the gradual onset of symptoms 5 days ago. Since onset he has had persistent, innumerable episodes of diarrhea. He notes minimal abdominal pain, though the pain is diffuse, crampy, worse following prolonged episodes of diarrhea. The patient also notes mild anorexia, no nausea, no vomiting. No fevers, no chills. No relief with anything. The patient has a history of prostate cancer, and had an episode of hematuria one week ago, and is currently scheduled for Lupron injection tomorrow as well as radiographic imaging tomorrow for further evaluation of his mass Past Medical History  Diagnosis Date  . Basal cell carcinoma   . Osteopenia   . COPD (chronic obstructive pulmonary disease)   . Spondylosis, lumbosacral   . Spondylisthesis   . Claudication   . Carotid artery bruit   . Other and unspecified hyperlipidemia   . Arthritis   . Prostate cancer   . Adenomatous colon polyp 2004, 2007, 2010  . Gallstones   . Pneumonia     Past Surgical History  Procedure Date  . Right total hip replacement   . Cholecystectomy     Family History  Problem Relation Age of Onset  . Heart disease Father   . Colon cancer Neg Hx   . Heart disease Son     History  Substance Use Topics  . Smoking status: Former Games developer  . Smokeless tobacco: Not on file  . Alcohol Use: No      Review of Systems  Constitutional:       Per HPI, otherwise negative  HENT:       Per HPI, otherwise negative  Eyes: Negative.   Respiratory:       Per HPI, otherwise negative  Cardiovascular:       Per HPI, otherwise negative    Gastrointestinal: Negative for vomiting.  Genitourinary: Negative.   Musculoskeletal:       Per HPI, otherwise negative  Skin: Negative.   Neurological: Negative for syncope.    Allergies  Review of patient's allergies indicates no known allergies.  Home Medications   Current Outpatient Rx  Name Route Sig Dispense Refill  . BICALUTAMIDE 50 MG PO TABS Oral Take 50 mg by mouth daily.      Marland Kitchen HYDROCODONE-ACETAMINOPHEN 5-325 MG PO TABS Oral Take 1 tablet by mouth 2 (two) times daily.      Marland Kitchen HYDROCORTISONE ACETATE 25 MG RE SUPP Rectal Place 25 mg rectally as needed.      Marland Kitchen HYDROXYCHLOROQUINE SULFATE 200 MG PO TABS Oral Take 200 mg by mouth daily.      Marland Kitchen LEUPROLIDE ACETATE (4 MONTH) 30 MG IM KIT Intramuscular Inject 30 mg into the muscle every 4 (four) months.      . METHOTREXATE 2.5 MG PO TABS Oral Take 2.5 mg by mouth once a week. Caution:Chemotherapy. Protect from light.4 TABS ONCE WEEKLY     . PRAVASTATIN SODIUM 40 MG PO TABS Oral Take 40 mg by mouth daily.      Marland Kitchen PREDNISONE 5 MG PO TABS Oral Take 5 mg by mouth daily.        BP 151/65  Pulse 99  Temp(Src) 97.6 F (36.4 C) (Oral)  Resp 20  SpO2 98%  Physical Exam  Nursing note and vitals reviewed. Constitutional: He is oriented to person, place, and time. He appears well-developed and well-nourished.  HENT:  Head: Normocephalic and atraumatic.  Eyes: Conjunctivae and EOM are normal.  Cardiovascular: Normal rate and regular rhythm.   Pulmonary/Chest: No stridor. No respiratory distress.  Abdominal: Soft. There is no tenderness.  Musculoskeletal: He exhibits no edema.  Neurological: He is alert and oriented to person, place, and time.  Skin: Skin is warm and dry.  Psychiatric: He has a normal mood and affect.    ED Course  Procedures (including critical care time)   Labs Reviewed  COMPREHENSIVE METABOLIC PANEL  LIPASE, BLOOD  URINALYSIS, ROUTINE W REFLEX MICROSCOPIC  CBC  DIFFERENTIAL   No results found.   No  diagnosis found.    MDM  This elderly gentleman presents with 4 days of diarrhea has a notable history of recent hematuria. On exam the patient is in no distress, with no pain elicited on exam and with unremarkable vital signs The patient's labs are notable for persistent hematuria without evidence of a UTI, stable hemoglobin (12.3) and preserved renal function. The patient's CT demonstrates colitis.  Given the absence of fever or leukocytosis with the patient's generally benign appearance, the patient is not a candidate for empiric antibiotic use. The patient will be discharged to follow up with primary care physician.        Gerhard Munch, MD 02/23/11 [phone]

## 2011-02-23 NOTE — ED Notes (Signed)
Articulate pt reports persistant diarrhea and dehydration from thursday

## 2011-08-19 ENCOUNTER — Other Ambulatory Visit (HOSPITAL_COMMUNITY): Payer: Self-pay | Admitting: Urology

## 2011-08-19 DIAGNOSIS — R972 Elevated prostate specific antigen [PSA]: Secondary | ICD-10-CM

## 2011-08-19 DIAGNOSIS — C61 Malignant neoplasm of prostate: Secondary | ICD-10-CM

## 2011-08-28 ENCOUNTER — Encounter (HOSPITAL_COMMUNITY): Payer: Self-pay

## 2011-08-28 ENCOUNTER — Ambulatory Visit (HOSPITAL_COMMUNITY)
Admission: RE | Admit: 2011-08-28 | Discharge: 2011-08-28 | Disposition: A | Discharge status: Home / Self Care | Payer: Medicare Other | Source: Ambulatory Visit | Attending: Urology | Admitting: Urology

## 2011-08-28 ENCOUNTER — Encounter (HOSPITAL_COMMUNITY)
Admission: RE | Admit: 2011-08-28 | Discharge: 2011-08-28 | Disposition: A | Discharge status: Home / Self Care | Payer: Medicare Other | Source: Ambulatory Visit | Attending: Urology | Admitting: Urology

## 2011-08-28 DIAGNOSIS — C61 Malignant neoplasm of prostate: Secondary | ICD-10-CM | POA: Insufficient documentation

## 2011-08-28 DIAGNOSIS — R972 Elevated prostate specific antigen [PSA]: Secondary | ICD-10-CM | POA: Insufficient documentation

## 2011-08-28 MED ORDER — TECHNETIUM TC 99M MEDRONATE IV KIT
24.5000 | PACK | Freq: Once | INTRAVENOUS | Status: AC | PRN
  Administered 2011-08-28: 24.5 via INTRAVENOUS

## 2011-09-04 ENCOUNTER — Ambulatory Visit (HOSPITAL_COMMUNITY)
Admission: RE | Admit: 2011-09-04 | Discharge: 2011-09-04 | Disposition: A | Discharge status: Home / Self Care | Payer: Medicare Other | Source: Ambulatory Visit | Attending: Urology | Admitting: Urology

## 2011-09-04 ENCOUNTER — Other Ambulatory Visit (HOSPITAL_COMMUNITY): Payer: Self-pay | Admitting: Urology

## 2011-09-04 DIAGNOSIS — Q762 Congenital spondylolisthesis: Secondary | ICD-10-CM | POA: Insufficient documentation

## 2011-09-04 DIAGNOSIS — M549 Dorsalgia, unspecified: Secondary | ICD-10-CM

## 2011-09-04 DIAGNOSIS — M129 Arthropathy, unspecified: Secondary | ICD-10-CM | POA: Insufficient documentation

## 2011-09-04 DIAGNOSIS — M545 Low back pain, unspecified: Secondary | ICD-10-CM | POA: Insufficient documentation

## 2011-12-08 ENCOUNTER — Telehealth: Payer: Self-pay | Admitting: Oncology

## 2011-12-08 NOTE — Telephone Encounter (Signed)
S/W pt in re NP appt 10/01 @ 1:30 w/ Dr. Clelia Croft.  Referring Dr. Mena Goes Dx-Prostate CA  NP packet will be handed to pt on arrival.

## 2011-12-08 NOTE — Telephone Encounter (Signed)
C/D 12/08/11 for appt. 12/09/11 °

## 2011-12-09 ENCOUNTER — Telehealth: Payer: Self-pay | Admitting: Oncology

## 2011-12-09 ENCOUNTER — Ambulatory Visit: Payer: Medicare Other

## 2011-12-09 ENCOUNTER — Other Ambulatory Visit (HOSPITAL_BASED_OUTPATIENT_CLINIC_OR_DEPARTMENT_OTHER): Payer: Medicare Other | Admitting: Lab

## 2011-12-09 ENCOUNTER — Ambulatory Visit (HOSPITAL_BASED_OUTPATIENT_CLINIC_OR_DEPARTMENT_OTHER): Payer: Medicare Other | Admitting: Oncology

## 2011-12-09 ENCOUNTER — Other Ambulatory Visit: Payer: Self-pay | Admitting: Oncology

## 2011-12-09 VITALS — BP 138/82 | HR 105 | Temp 96.8°F | Resp 18 | Ht 72.0 in | Wt 136.6 lb

## 2011-12-09 DIAGNOSIS — C61 Malignant neoplasm of prostate: Secondary | ICD-10-CM

## 2011-12-09 DIAGNOSIS — N3289 Other specified disorders of bladder: Secondary | ICD-10-CM

## 2011-12-09 LAB — CBC WITH DIFFERENTIAL/PLATELET
Basophils Absolute: 0.1 10*3/uL (ref 0.0–0.1)
EOS%: 1.7 % (ref 0.0–7.0)
Eosinophils Absolute: 0.1 10*3/uL (ref 0.0–0.5)
HGB: 11.4 g/dL — ABNORMAL LOW (ref 13.0–17.1)
MCH: 30.4 pg (ref 27.2–33.4)
NEUT#: 4.8 10*3/uL (ref 1.5–6.5)
RBC: 3.75 10*6/uL — ABNORMAL LOW (ref 4.20–5.82)
RDW: 17.1 % — ABNORMAL HIGH (ref 11.0–14.6)
lymph#: 1.7 10*3/uL (ref 0.9–3.3)

## 2011-12-09 LAB — COMPREHENSIVE METABOLIC PANEL (CC13)
AST: 23 U/L (ref 5–34)
Albumin: 3.6 g/dL (ref 3.5–5.0)
BUN: 15 mg/dL (ref 7.0–26.0)
Calcium: 9.2 mg/dL (ref 8.4–10.4)
Chloride: 105 mEq/L (ref 98–107)
Potassium: 3.8 mEq/L (ref 3.5–5.1)
Sodium: 138 mEq/L (ref 136–145)
Total Protein: 7 g/dL (ref 6.4–8.3)

## 2011-12-09 NOTE — Telephone Encounter (Signed)
gv and printed appt for pt. °

## 2011-12-09 NOTE — Progress Notes (Signed)
CC:   Javier Field, MD Ernestina Penna, M.D.  REASON FOR CONSULTATION:  Prostate cancer.  HISTORY OF PRESENT ILLNESS:  [name] is a pleasant 78 year old man of [place], [place], who lived the majority of his life around that area. He had been in multiple industries including textile and farming.  He is currently retired and lives with his wife.  He is a gentleman with past medical history significant for rheumatoid arthritis and on methotrexate and prednisone.  He has a history of prostate cancer that dates back to 1993 where he presented with a PSA of 15.9.  His biopsy done on April 20, 1991, showed a prostate adenocarcinoma, combined score of 4 + 3 = 7. Clinically, right and left side prostate involvement.  His PSA at that time was questionable.  I saw 2 references.  One was 15 and another one was 50.9.  The patient was treated with Lupron under the care of Dr. Larey Dresser initially with a PSA nadir of close to 0.  His PSA remained very low up until February 2012 where his PSA was up to 2.09. His testosterone was castrate at that time and Casodex was added.  In June 2012, his PSA continued to go up to 4.05 and subsequently was high at 8.25 in February 2013.  Subsequently, in June 2013 his PSA was up to 67.  He had a bone scan at that time which showed an L2 lesion. However, no clear-cut cancerous lesion at that time.  His x-rays were normal.  Felt that this is probably atherosclerotic or sclerotic, or arthritic in nature.  The patient subsequently started developing urinary retention and Foley catheter was placed on September 30th.  The patient did have a cystoscopy and a CT scan back in December 2012 which did not show any evidence of any metastatic disease.  Clinically, [name] reports feeling relatively fair.  He is relatively frail at times.  He is able to ambulate with the help of a cane and does not drive.  He does not have any back pain, shoulder pain.  He has  lost about 10 pounds in the last 6 months, but does not really report any decline in his energy or performance status.  He has, again, urinary retention as his biggest issue and again he is on some tamsulosin as well as an indwelling Foley catheter which have helped his symptoms.  REVIEW OF SYSTEMS:  He did not report any headaches, blurry vision, double vision.  He does not report any motor or sensory neuropathy.  He did not report any alteration of mental status.  He did not report any psychiatric issues or depression.  He did not report any fever, chills, sweats, any cough, hemoptysis, hematemesis.  No nausea, vomiting, any abdominal pain, hematochezia, or melena.  No genitourinary complaints. The rest of the review of systems was unremarkable.  PAST MEDICAL HISTORY:  Significant for rheumatoid arthritis, history of hyperlipidemia, status post his hip surgery, and cholecystectomy.  MEDICATION:  He is on hydrocodone, acetaminophen, methotrexate, pravastatin, prednisone, tamsulosin.  It is unclear if he has taken bicalutamide or not.  He was prescribed it but according to his medication list he is not taking it.  ALLERGIES:  None.  FAMILY HISTORY:  No history of any prostate cancer or genitourinary cancers from what he can tell.  SOCIAL HISTORY:  He is married.  He has 7 children; one deceased.  Quit smoking about 20 years ago.  Does not really drink.  PHYSICAL  EXAMINATION:  General:  Alert, awake man appeared in no active distress today.  Vital Signs:  His blood pressure is 138/82, pulse 105, respirations 18, temperature is 96, weight is 136 pounds.  ECOG performance status is 2.  HEENT:  Head is normocephalic, atraumatic. Pupils equal, round, and reactive to light.  Oral mucosa moist and pink. Neck:  Supple without adenopathy.  Heart:  Regular rate and rhythm.  S1, S2.  Lungs:  Clear to auscultation.  Abdomen:  Soft, nontender.  No hepatosplenomegaly.  Extremities:  No  clubbing, cyanosis, or edema. Neurological:  Intact motor, sensory, and deep tendon reflexes.  LABORATORY DATA:  Hemoglobin 11.4, white cell count 7.2, platelet count of 237.  His last PSA from what I can see back in July 2013 was 67.  ASSESSMENT AND PLAN:  This is a pleasant 78 year old gentleman with the following issues. 1. Prostate cancer.  He stated he was diagnosed dating back to 40.     He had a Gleason score of 7.  It is unclear whether his PSA was 15     or 50.  But nonetheless, he was treated with hormonal deprivation     only.  His disease had been under reasonable control until     recently, PSA of 67 with a rapid doubling time.  He had a bone scan     back in June which showed really no convincing evidence of any     metastatic disease.  His last CT scan was a hematuria scan back in     December 2012.  I had a lengthy discussion today with the Mr.     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  At this time, I have to presume that he developing     castration-resistant disease.  I will confirm that with     measurements of his PSA and testosterone.  The next step would be     to look for measurable disease.  He even had a CT scan about 6     months from what I can tell and based on these findings we can     certainly discuss the treatment options.  These treatment options     would include, not limited to, second-line hormone manipulation,     ketoconazole and prednisone, also a form of Zytiga, also systemic     chemotherapy which I think he would be rather poor candidate for,     unfortunately.  I will also probably present him at the     Multidisciplinary Tumor Board to get an idea about his urinary     retention as well as to get a feedback from Urology regarding that. 2. Urinary retention.  He could certainly have a local recurrence of     the prostate causing  his urinary retention and for that reason I     wonder with consultation with Dr. Mena Goes whether a transurethral     resection of the prostate for palliative purposes might be     reasonable in this man and also maybe to document a local     recurrence at that time.  The terms of management for [name]     really depend on what his disease level is.  If his disease is     systemic, then we will treat him systemically.  If his disease is  localized, then maybe a TURP procedure or possible pelvic radiation     may be the best way to go.  The plan will be at this point to     repeat imaging studies, CT scan abdomen and pelvis, also present at     the Genitourinary Multidisciplinary Board, and then make a decision     based on that.    ______________________________ Benjiman Core, M.D. FNS/MEDQ  D:  12/09/2011  T:  12/09/2011  Job:  [id number]

## 2011-12-09 NOTE — Progress Notes (Signed)
Note dictated

## 2011-12-16 ENCOUNTER — Ambulatory Visit (HOSPITAL_COMMUNITY)
Admission: RE | Admit: 2011-12-16 | Discharge: 2011-12-16 | Disposition: A | Discharge status: Home / Self Care | Payer: Medicare Other | Source: Ambulatory Visit | Attending: Oncology | Admitting: Oncology

## 2011-12-16 DIAGNOSIS — C61 Malignant neoplasm of prostate: Secondary | ICD-10-CM | POA: Insufficient documentation

## 2011-12-16 DIAGNOSIS — C7951 Secondary malignant neoplasm of bone: Secondary | ICD-10-CM | POA: Insufficient documentation

## 2011-12-16 DIAGNOSIS — Z79899 Other long term (current) drug therapy: Secondary | ICD-10-CM | POA: Insufficient documentation

## 2011-12-16 MED ORDER — IOHEXOL 300 MG/ML  SOLN
100.0000 mL | Freq: Once | INTRAMUSCULAR | Status: AC | PRN
  Administered 2011-12-16: 100 mL via INTRAVENOUS

## 2011-12-23 ENCOUNTER — Telehealth: Payer: Self-pay | Admitting: Oncology

## 2011-12-23 ENCOUNTER — Ambulatory Visit (HOSPITAL_BASED_OUTPATIENT_CLINIC_OR_DEPARTMENT_OTHER): Payer: Medicare Other | Admitting: Oncology

## 2011-12-23 VITALS — BP 150/79 | HR 101 | Temp 97.0°F | Resp 20 | Ht 72.0 in | Wt 136.0 lb

## 2011-12-23 DIAGNOSIS — C61 Malignant neoplasm of prostate: Secondary | ICD-10-CM

## 2011-12-23 NOTE — Progress Notes (Signed)
Hematology and Oncology Follow Up Visit  [name] [id number] 25-Jul-1930 78 y.o. 12/23/2011 9:38 AM   Principle Diagnosis: 78 year old with diagnosis of prostate cancer in 1993. Gleason score 4+3=7.  Prior Therapy: Androgen deprivation. Lupron + casodex upfront therapy.   Current therapy: Lupron only. Recently developed local relapse with rising PSA despite castrate levels testosterone.   Interim History:  78 year old man returns for a follow up visit. I saw him back on 10/1 for an evaluation for rising PSA and urinary retention. He continues to have a foley catheter in place. His PSA is up to 136 (from 67). His case was discussed in the GU cancer conference. He continue to do well. No new pain reported at this time. Clinically, [name] reports feeling relatively fair. He is relatively frail at times. He is able to ambulate with the help of a cane and does not drive. He does not have any back pain, shoulder pain. He does not really report any decline in his energy or performance status. He has, again, urinary retention as his biggest issue and again he is on some tamsulosin as well as an indwelling Foley catheter which have helped his symptoms.   Medications: I have reviewed the patient's current medications. Current outpatient prescriptions:bicalutamide (CASODEX) 50 MG tablet, Take 50 mg by mouth daily.  , Disp: , Rfl: ;  HYDROcodone-acetaminophen (NORCO) 5-325 MG per tablet, Take 1 tablet by mouth 2 (two) times daily.  , Disp: , Rfl: ;  hydrocortisone (ANUSOL-HC) 25 MG suppository, Place 25 mg rectally as needed.  , Disp: , Rfl: ;  hydroxychloroquine (PLAQUENIL) 200 MG tablet, Take 200 mg by mouth daily.  , Disp: , Rfl:  leuprolide (LUPRON) 30 MG injection, Inject 30 mg into the muscle every 4 (four) months.  , Disp: , Rfl: ;  methotrexate (RHEUMATREX) 2.5 MG tablet, Take 2.5 mg by mouth once a week. Caution:Chemotherapy. Protect from light.4 TABS ONCE WEEKLY , Disp: , Rfl: ;  pravastatin  (PRAVACHOL) 40 MG tablet, Take 40 mg by mouth daily.  , Disp: , Rfl: ;  predniSONE (DELTASONE) 5 MG tablet, Take 5 mg by mouth daily.  , Disp: , Rfl:  Tamsulosin HCl (FLOMAX) 0.4 MG CAPS, Take by mouth., Disp: , Rfl:   Allergies: No Known Allergies  Past Medical History, Surgical history, Social history, and Family History were reviewed and updated.  Review of Systems: Constitutional:  Negative for fever, chills, night sweats, anorexia, weight loss, pain. Cardiovascular: no chest pain or dyspnea on exertion Respiratory: negative Neurological: negative Dermatological: negative ENT: negative Skin: Negative. Gastrointestinal: negative Genito-Urinary: negative Hematological and Lymphatic: negative Breast: negative Musculoskeletal: negative Remaining ROS negative. Physical Exam: Blood pressure 150/79, pulse 101, temperature 97 F (36.1 C), temperature source Oral, resp. rate 20, height 6' (1.829 m), weight 136 lb (61.689 kg). ECOG: 1 General appearance: alert Head: Normocephalic, without obvious abnormality, atraumatic Neck: no adenopathy, no carotid bruit, no JVD, supple, symmetrical, trachea midline and thyroid not enlarged, symmetric, no tenderness/mass/nodules Lymph nodes: Cervical, supraclavicular, and axillary nodes normal. Heart:regular rate and rhythm, S1, S2 normal, no murmur, click, rub or gallop Lung:chest clear, no wheezing, rales, normal symmetric air entry Abdomin: soft, non-tender, without masses or organomegaly EXT:no erythema, induration, or nodules   Lab Results: Lab Results  Component Value Date   WBC 7.2 12/09/2011   HGB 11.4* 12/09/2011   HCT 34.9* 12/09/2011   MCV 93.1 12/09/2011   PLT 237 12/09/2011     Chemistry  Component Value Date/Time   NA 138 12/09/2011 1334   NA 140 02/23/2011 1150   K 3.8 12/09/2011 1334   K 3.3* 02/23/2011 1150   CL 105 12/09/2011 1334   CL 105 02/23/2011 1150   CO2 22 12/09/2011 1334   CO2 23 02/23/2011 1150   BUN 15.0  12/09/2011 1334   BUN 17 02/23/2011 1150   CREATININE 0.8 12/09/2011 1334   CREATININE 0.80 02/23/2011 1150      Component Value Date/Time   CALCIUM 9.2 12/09/2011 1334   CALCIUM 9.5 02/23/2011 1150   ALKPHOS 104 12/09/2011 1334   ALKPHOS 60 02/23/2011 1150   AST 23 12/09/2011 1334   AST 15 02/23/2011 1150   ALT 13 12/09/2011 1334   ALT 7 02/23/2011 1150   BILITOT 1.00 12/09/2011 1334   BILITOT 1.7* 02/23/2011 1150      Impression and Plan:  This is a pleasant 78 year old gentleman with the  following issues.  1. Prostate cancer. He stated he was diagnosed dating back to 34. He had a Gleason score of 7. His PSA is up to 136 which has doubled in about 4 months. He does have local relapse and urinary retention. I feel he will likely need systemic therapy but first local therapy is indicated.  He will have a TURP scheduled by Dr. Mena Goes. In the near future. I will evaluate him after that to determine the role of systemic therapy.   2. Androgen deprivation: he will continue on Lupron.  3. Follow up in 2 months.    [facility] [place], MD 10/15/20139:38 AM

## 2011-12-23 NOTE — Telephone Encounter (Signed)
Printed and gv pt appt schedule for DEC 2013

## 2011-12-31 ENCOUNTER — Other Ambulatory Visit: Payer: Self-pay | Admitting: Urology

## 2012-01-19 ENCOUNTER — Encounter (HOSPITAL_COMMUNITY)
Admission: RE | Admit: 2012-01-19 | Discharge: 2012-01-19 | Disposition: A | Discharge status: Home / Self Care | Payer: Medicare Other | Source: Ambulatory Visit | Attending: Urology | Admitting: Urology

## 2012-01-19 ENCOUNTER — Encounter (HOSPITAL_COMMUNITY): Payer: Self-pay

## 2012-01-19 HISTORY — DX: Gastro-esophageal reflux disease without esophagitis: K21.9

## 2012-01-19 LAB — CBC
HCT: 36.9 % — ABNORMAL LOW (ref 39.0–52.0)
MCH: 29.7 pg (ref 26.0–34.0)
MCHC: 32 g/dL (ref 30.0–36.0)
MCV: 92.9 fL (ref 78.0–100.0)
RDW: 16.2 % — ABNORMAL HIGH (ref 11.5–15.5)
WBC: 7.6 10*3/uL (ref 4.0–10.5)

## 2012-01-19 NOTE — Patient Instructions (Signed)
20      Your procedure is scheduled on:  Friday 01/23/2012 at 0900 am  Report to [facility] at 0630 AM.  Call this number if you have problems the morning of surgery: [phone]   Remember:   Do not eat food or drink liquids after midnight!  Take these medicines the morning of surgery with A SIP OF WATER: none   Do not bring valuables to the hospital.  .  Leave suitcase in the car. After surgery it may be brought to your room.  For patients admitted to the hospital, checkout time is 11:00 AM the day of              Discharge.    Special Instructions: See [facility] Preparing  For Surgery Instruction Sheet. Do not wear jewelry, lotions powders, perfumes. Women do not shave legs or underarms for 12 hours before showers. Contacts, partial plates, or dentures may not be worn into surgery.                          Patients discharged the day of surgery will not be allowed to drive home.   If going home the same day of surgery, must have someone stay with you  first 24 hrs.at home and arrange for someone to drive you home from the Hospital.            YOUR DRIVER IS: Mattie-spouse   Please read over the following fact sheets that you were given: MRSA INFORMATION,INCENTIVE SPIROMETRY SHEET, SLEEP APNEA SHEET                            Telford Nab.Zebulin Siegel,RN,BSN     [phone]

## 2012-01-22 NOTE — H&P (Signed)
This 78 year old gentleman returns with prostate cancer. Presenting in 1993 with a PSA of 15.9,  Gleason 7 carcinoma and a suspicious bone scan. He has been on chronic hormonal depravation therapy and currently takes Lupron and Casodex. His PSA is rising.   --Jan 2011 PSA 1.03 --May 2011 PSA 1.21 --Dec 2011 PSA 1.79 --Feb 2012 PSA 2.09, T < 10 -- Casodex added --Jun 2012 - normal DRE - small prostate  --Jul 2012 PSA 4.05 --Dec 2012 PSA 5.45, CMP neg, CT A/P negative for mets --Feb 2013 PSA 8.25 (PCP lab) => PSADT 7 months --Jun 2013 PSA 67; tamsulosin added; Bone scan possible L2 lesion, normal xray --Jul 2013 PSA 67 --Sep 2013 foley placed and 300 ml drained.   Microhematuria UA Dec 2012 showed microhematuria. Cystoscopy and CT Hematuria were normal Dec 2012.  No h/o kidney stones. Quit smoking in 1993. No XRT or chemo. No dye / solvent exposure.   Interval Hx He returns today with a Foley catheter. He's had no fevers or chills.   Past Medical History Problems  1. History of  Esophageal Reflux 530.81  Surgical History Problems  1. History of  Cholecystectomy 2. History of  Hip Surgery  Current Meds 1. Hydrocodone-Acetaminophen 5-325 MG Oral Tablet; Therapy: 19Aug2011 to 2. Methotrexate 2.5 MG Oral Tablet; Therapy: 23Jul2010 to 3. PredniSONE 5 MG Oral Tablet; Therapy: 01Dec2010 to  Allergies Medication  1. No Known Drug Allergies  Family History Problems  1. Maternal history of  Cancer 2. Paternal history of  Death In The Family Father AccidentAge 13 3. Maternal history of  Death In The Family Mother CancerAGe 60 4. Family history of  Family Health Status Number Of Children 4 SONS, 3 DAUGHTERS 5. Family history of  Prostate Cancer V16.42  Social History Problems  1. Caffeine Use 2. Marital History - Currently Married 3. Occupation: RETIRED 4. Tobacco Use V15.82 SMOKED 1 PK FOR 20 YRS - QUIT 12 YRS Denied  5. Alcohol Use  Review of  Systems Constitutional, cardiovascular, pulmonary, gastrointestinal and neurological system(s) were reviewed and pertinent findings if present are noted.    Vitals Vital Signs [Data Includes: Last 1 Day]  23Oct2013 11:02AM  BMI Calculated: 18.02 BSA Calculated: 1.82 Height: 6 ft 1 in Weight: 136 lb  Blood Pressure: 127 / 80 Temperature: 98.3 F Heart Rate: 89  Physical Exam ENT:. The ears and nose are normal in appearance.  Pulmonary: No respiratory distress and normal respiratory rhythm and effort.  Cardiovascular: Heart rate and rhythm are normal . No peripheral edema.  Neuro/Psych:. Mood and affect are appropriate.    Results/Data  Flow Rate: Instilled volume 350 ml . Voided 90 ml. A peak flow rate of 7ml/s, mean flow rate of 79ml/s and Flat, prolonged flow curve .    Procedure  Procedure: Cystoscopy   Indication: Lower Urinary Tract Symptoms.  Informed Consent: Risks, benefits, and potential adverse events were discussed and informed consent was obtained from the patient.  Prep: The patient was prepped with betadine.  Antibiotic prophylaxis: Ciprofloxacin.  Procedure Note:  Urethral meatus:. No abnormalities.  Anterior urethra: No abnormalities.  Prostatic urethra: No abnormalities.    Toward the right bladder neck there is a lobulated mass that may be prostate cancer extension into the bladder or a primary bladder tumor. Visualization was poor due to some cloudy urine and some prostatic bleeding. Overall the prostate is short and nonobstructive.  Bladder: Visulization was clear. The ureteral orifices were in the normal  anatomic position bilaterally and had clear efflux of urine. A systematic survey of the bladder demonstrated no bladder tumors or stones. The mucosa was smooth without abnormalities. The patient tolerated the procedure well.  Complications: None.    Assessment Assessed  1. 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 Acute Urinary Retention  788.20  Plan Acinar Cell Carcinoma Of The Prostate Gland (185), Acute Urinary Retention (788.20)  1. Cysto  Done: 23Oct2013 Acute Urinary Retention (788.20)  2. 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 Our plan to perform resection of this bladder/bladder neck mass as well as possibly transurethral incision of the prostate and bladder neck. We also discussed the risks of incontinence and stricture formation for surgery. We discussed flow symptoms and retention can improve after surgery but urinary frequency and urgency can also develop or worsen. We discussed alternatives such as CIC. All questions answered they elect to proceed.   I also discussed with him the results of our tumor board discussion and that we would continue primarily treatment to control this disease and not curative therapy.  cc; Dr. Clelia Croft; Dr. Rudi Heap     Signatures Electronically signed by : Jerilee Field, M.D.; Dec 31 2011  1:27PM

## 2012-01-23 ENCOUNTER — Encounter (HOSPITAL_COMMUNITY): Payer: Self-pay | Admitting: *Deleted

## 2012-01-23 ENCOUNTER — Ambulatory Visit (HOSPITAL_COMMUNITY)
Admission: RE | Admit: 2012-01-23 | Discharge: 2012-01-23 | Disposition: A | Discharge status: Home / Self Care | Payer: Medicare Other | Source: Ambulatory Visit | Attending: Urology | Admitting: Urology

## 2012-01-23 ENCOUNTER — Encounter (HOSPITAL_COMMUNITY)
Admission: RE | Disposition: A | Discharge status: Home / Self Care | Payer: Self-pay | Source: Ambulatory Visit | Attending: Urology

## 2012-01-23 ENCOUNTER — Encounter (HOSPITAL_COMMUNITY): Payer: Self-pay | Admitting: Anesthesiology

## 2012-01-23 ENCOUNTER — Ambulatory Visit (HOSPITAL_COMMUNITY): Payer: Medicare Other | Admitting: Anesthesiology

## 2012-01-23 DIAGNOSIS — R339 Retention of urine, unspecified: Secondary | ICD-10-CM | POA: Insufficient documentation

## 2012-01-23 DIAGNOSIS — Z01812 Encounter for preprocedural laboratory examination: Secondary | ICD-10-CM | POA: Insufficient documentation

## 2012-01-23 DIAGNOSIS — Z87891 Personal history of nicotine dependence: Secondary | ICD-10-CM | POA: Insufficient documentation

## 2012-01-23 DIAGNOSIS — C61 Malignant neoplasm of prostate: Secondary | ICD-10-CM | POA: Insufficient documentation

## 2012-01-23 HISTORY — PX: TRANSURETHRAL PROSTATECTOMY WITH GYRUS INSTRUMENTS: SHX6153

## 2012-01-23 SURGERY — TRANSURETHRAL PROSTATECTOMY WITH GYRUS INSTRUMENTS
Anesthesia: General | Wound class: Clean Contaminated

## 2012-01-23 MED ORDER — PROPOFOL 10 MG/ML IV BOLUS
INTRAVENOUS | Status: DC | PRN
  Administered 2012-01-23: 150 mg via INTRAVENOUS
  Administered 2012-01-23: 10 mg via INTRAVENOUS

## 2012-01-23 MED ORDER — ONDANSETRON HCL 4 MG/2ML IJ SOLN
INTRAMUSCULAR | Status: DC | PRN
  Administered 2012-01-23: 4 mg via INTRAVENOUS

## 2012-01-23 MED ORDER — HYDROMORPHONE HCL PF 1 MG/ML IJ SOLN: 0.2500 mg | INTRAMUSCULAR | Status: DC | PRN

## 2012-01-23 MED ORDER — SODIUM CHLORIDE 0.9 % IR SOLN
Status: DC | PRN
  Administered 2012-01-23: 12000 mL via INTRAVESICAL

## 2012-01-23 MED ORDER — FENTANYL CITRATE 0.05 MG/ML IJ SOLN
INTRAMUSCULAR | Status: DC | PRN
  Administered 2012-01-23: 50 ug via INTRAVENOUS
  Administered 2012-01-23 (×2): 25 ug via INTRAVENOUS

## 2012-01-23 MED ORDER — LIDOCAINE HCL (CARDIAC) 20 MG/ML IV SOLN
INTRAVENOUS | Status: DC | PRN
  Administered 2012-01-23: 50 mg via INTRAVENOUS

## 2012-01-23 MED ORDER — LACTATED RINGERS IV SOLN
INTRAVENOUS | Status: DC | PRN
  Administered 2012-01-23 (×2): via INTRAVENOUS

## 2012-01-23 MED ORDER — CEFAZOLIN SODIUM-DEXTROSE 2-3 GM-% IV SOLR
INTRAVENOUS | Status: AC
  Filled 2012-01-23: qty 50

## 2012-01-23 MED ORDER — CEPHALEXIN 500 MG PO CAPS: 500.0000 mg | ORAL_CAPSULE | Freq: Every day | ORAL | Status: DC

## 2012-01-23 MED ORDER — DEXAMETHASONE SODIUM PHOSPHATE 10 MG/ML IJ SOLN
INTRAMUSCULAR | Status: DC | PRN
  Administered 2012-01-23: 10 mg via INTRAVENOUS

## 2012-01-23 MED ORDER — PROMETHAZINE HCL 25 MG/ML IJ SOLN: 6.2500 mg | INTRAMUSCULAR | Status: DC | PRN

## 2012-01-23 MED ORDER — CEFAZOLIN SODIUM-DEXTROSE 2-3 GM-% IV SOLR
2.0000 g | INTRAVENOUS | Status: AC
  Administered 2012-01-23: 2 g via INTRAVENOUS

## 2012-01-23 MED ORDER — EPHEDRINE SULFATE 50 MG/ML IJ SOLN
INTRAMUSCULAR | Status: DC | PRN
  Administered 2012-01-23: 10 mg via INTRAVENOUS

## 2012-01-23 SURGICAL SUPPLY — 18 items
BAG URINE DRAINAGE (UROLOGICAL SUPPLIES) ×2 IMPLANT
BAG URO CATCHER STRL LF (DRAPE) ×2 IMPLANT
CATH FOLEY 3WAY 30CC 22FR (CATHETERS) ×2 IMPLANT
CLOTH BEACON ORANGE TIMEOUT ST (SAFETY) ×2 IMPLANT
DRAPE CAMERA CLOSED 9X96 (DRAPES) ×2 IMPLANT
ELECT BUTTON HF 24-28F 2 30DE (ELECTRODE) IMPLANT
ELECT LOOP MED HF 24F 12D (CUTTING LOOP) IMPLANT
ELECT LOOP MED HF 24F 12D CBL (CLIP) ×2 IMPLANT
ELECT RESECT VAPORIZE 12D CBL (ELECTRODE) IMPLANT
EVACUATOR MICROVAS BLADDER (UROLOGICAL SUPPLIES) ×2 IMPLANT
GLOVE BIOGEL M STRL SZ7.5 (GLOVE) ×2 IMPLANT
GOWN STRL REIN XL XLG (GOWN DISPOSABLE) ×2 IMPLANT
HOLDER FOLEY CATH W/STRAP (MISCELLANEOUS) ×2 IMPLANT
IV NS IRRIG 3000ML ARTHROMATIC (IV SOLUTION) IMPLANT
MANIFOLD NEPTUNE II (INSTRUMENTS) ×2 IMPLANT
PACK CYSTO (CUSTOM PROCEDURE TRAY) ×2 IMPLANT
SUT ETHILON 3 0 PS 1 (SUTURE) IMPLANT
SYR 30ML LL (SYRINGE) ×2 IMPLANT

## 2012-01-23 NOTE — Progress Notes (Signed)
PACU Dr. Mena Goes in to see. Urine clear. CBI stopped.

## 2012-01-23 NOTE — Progress Notes (Signed)
PACU Dr. Mena Goes aware of clear urine without clots. He will write appropriate orders.

## 2012-01-23 NOTE — Transfer of Care (Signed)
Immediate Anesthesia Transfer of Care Note  Patient: [name]  Procedure(s) Performed: Procedure(s) (LRB) with comments: TRANSURETHRAL PROSTATECTOMY WITH GYRUS INSTRUMENTS (N/A)  Patient Location: PACU  Anesthesia Type:General  Level of Consciousness: sedated  Airway & Oxygen Therapy: Patient Spontanous Breathing and Patient connected to face mask oxygen  Post-op Assessment: Report given to PACU RN and Post -op Vital signs reviewed and stable  Post vital signs: Reviewed and stable  Complications: No apparent anesthesia complications

## 2012-01-23 NOTE — Interval H&P Note (Signed)
History and Physical Interval Note:  01/23/2012 9:09 AM  [name]  has presented today for surgery, with the diagnosis of prostate cancer   The various methods of treatment have been discussed with the patient and family. After consideration of risks, benefits and other options for treatment, the patient has consented to  Procedure(s) (LRB) with comments: TRANSURETHRAL PROSTATECTOMY WITH GYRUS INSTRUMENTS (N/A) as a surgical intervention .  The patient's history has been reviewed, patient examined, no change in status, stable for surgery.  I have reviewed the patient's chart and labs. I discussed with the patient the nature, potential benefits, risks and alternatives to TURP/TUIP/TURBT, including side effects of the proposed treatment, the likelihood of the patient achieving the goals of the procedure, and any potential problems that might occur during the procedure or recuperation. Questions were answered to the patient's satisfaction.  He elects to proceed as planned.   [name]

## 2012-01-23 NOTE — Progress Notes (Signed)
Foley bag changed to leg bag ( provided by [facility])

## 2012-01-23 NOTE — Anesthesia Postprocedure Evaluation (Signed)
  Anesthesia Post-op Note  Patient: [name]  Procedure(s) Performed: Procedure(s) (LRB): TRANSURETHRAL PROSTATECTOMY WITH GYRUS INSTRUMENTS (N/A)  Patient Location: PACU  Anesthesia Type: General  Level of Consciousness: awake and alert   Airway and Oxygen Therapy: Patient Spontanous Breathing  Post-op Pain: mild  Post-op Assessment: Post-op Vital signs reviewed, Patient's Cardiovascular Status Stable, Respiratory Function Stable, Patent Airway and No signs of Nausea or vomiting  Post-op Vital Signs: stable  Complications: No apparent anesthesia complications

## 2012-01-23 NOTE — Op Note (Signed)
Preoperative diagnosis: Prostate cancer, urinary retention  Postop diagnosis: Same  Procedure: Channel TURP  Surgeon: Mena Goes  Anesthesia: Gen.  Findings: On cystoscopy there was a friable irregular tumor extending from 12:00 down the right bladder neck and prostatic urethra to the 5 or 6:00 position and extending to the apex.The tumor filled the prostatic urethra and incorporated and obliterated the vera montanum. Resection of this tumor was carried out to create a channel from 12:00 to 6:00. The left side the prostate and bladder neck were not resected. Tumor was left along the prostatic urethra and especially the apex to ensure an open outlet for voiding and to decrease the risk of incontinence.  Description of procedure: After consent was obtained patient brought the operating room. A timeout performed To confirm the patient and procedure. After adequate anesthesiaThe patient was placed in lithotomy position. He was prepped and draped in the usual fashion. A cystoscope was passed per urethra and the bladder examined with 12 and 70 lens. No other abnormal findings were noted.  The trigone and ureteral orifice these appeared normal and were visualized pre-and post resection and noted to be without injury. Beginning at approximately the 8:00 position the tumor was resected from the bladder neck down to the apex to Start a channel and gauge the depth of resection.Next resection was carried from 12:00 down to the 8:00 position to connect the resection sites. Resection was carried to the bladder neck fibers but the depth was tapered and more shallow toward the apex. Carefully the 6:00 - 8:00 tumor was resected toward the apex. The left lateral lobe was well preserved and used as a landmark. Adequate hemostasis was ensured And all the chips were drained. This created an open channel for voiding. The scope was removed and a 22 [place] three-way Foley catheter was placed and left to gravity drainage. The  urine was clear but CBI was connected for wakeup and transfer. The patient was awakened and taken to the recovery room in stable condition.  EBL: minimal  Complication: none  Drain: 22 Fr three way foley  Specimen: TURP chips  Disposition: patient stable to PACU.

## 2012-01-23 NOTE — Anesthesia Preprocedure Evaluation (Addendum)
Anesthesia Evaluation  Patient identified by MRN, date of birth, ID band Patient awake    Reviewed: Allergy & Precautions, H&P , NPO status , Patient's Chart, lab work & pertinent test results, reviewed documented beta blocker date and time   Airway Mallampati: II TM Distance: >3 FB Neck ROM: full    Dental No notable dental hx.    Pulmonary pneumonia -, resolved, COPD Denies SOB.  breath sounds clear to auscultation  Pulmonary exam normal       Cardiovascular Exercise Tolerance: Good Rhythm:regular Rate:Normal  Mitral regurgitation. Aortic Sclerosis. Claudication   ECHO from 04/18/09 reviewed. EF good. Mild-Moderate MR.   Neuro/Psych negative neurological ROS  negative psych ROS   GI/Hepatic Neg liver ROS, GERD-  ,  Endo/Other  negative endocrine ROS  Renal/GU negative Renal ROS  negative genitourinary   Musculoskeletal  (+) Arthritis -, Rheumatoid disorders,    Abdominal   Peds  Hematology negative hematology ROS (+)   Anesthesia Other Findings   Reproductive/Obstetrics negative OB ROS                         Anesthesia Physical Anesthesia Plan  ASA: III  Anesthesia Plan: General LMA   Post-op Pain Management:    Induction:   Airway Management Planned:   Additional Equipment:   Intra-op Plan:   Post-operative Plan:   Informed Consent: I have reviewed the patients History and Physical, chart, labs and discussed the procedure including the risks, benefits and alternatives for the proposed anesthesia with the patient or authorized representative who has indicated his/her understanding and acceptance.   Dental Advisory Given  Plan Discussed with: CRNA  Anesthesia Plan Comments: (Discussed r/b general versus spinal. He prefers general.)       Anesthesia Quick Evaluation

## 2012-01-26 ENCOUNTER — Encounter (HOSPITAL_COMMUNITY): Payer: Self-pay | Admitting: Urology

## 2012-02-20 ENCOUNTER — Telehealth: Payer: Self-pay | Admitting: Oncology

## 2012-02-20 ENCOUNTER — Other Ambulatory Visit (HOSPITAL_BASED_OUTPATIENT_CLINIC_OR_DEPARTMENT_OTHER): Payer: Medicare Other | Admitting: Lab

## 2012-02-20 ENCOUNTER — Ambulatory Visit (HOSPITAL_BASED_OUTPATIENT_CLINIC_OR_DEPARTMENT_OTHER): Payer: Medicare Other | Admitting: Oncology

## 2012-02-20 VITALS — BP 145/84 | HR 91 | Temp 97.2°F | Resp 18 | Ht 71.0 in | Wt 137.2 lb

## 2012-02-20 DIAGNOSIS — C61 Malignant neoplasm of prostate: Secondary | ICD-10-CM

## 2012-02-20 DIAGNOSIS — E291 Testicular hypofunction: Secondary | ICD-10-CM

## 2012-02-20 DIAGNOSIS — R339 Retention of urine, unspecified: Secondary | ICD-10-CM

## 2012-02-20 LAB — COMPREHENSIVE METABOLIC PANEL (CC13)
ALT: 11 U/L (ref 0–55)
Albumin: 3.6 g/dL (ref 3.5–5.0)
Alkaline Phosphatase: 108 U/L (ref 40–150)
CO2: 26 mEq/L (ref 22–29)
Glucose: 90 mg/dl (ref 70–99)
Potassium: 3.6 mEq/L (ref 3.5–5.1)
Sodium: 142 mEq/L (ref 136–145)
Total Bilirubin: 1.05 mg/dL (ref 0.20–1.20)
Total Protein: 7 g/dL (ref 6.4–8.3)

## 2012-02-20 LAB — CBC WITH DIFFERENTIAL/PLATELET
BASO%: 0.7 % (ref 0.0–2.0)
Eosinophils Absolute: 0.1 10*3/uL (ref 0.0–0.5)
LYMPH%: 22.4 % (ref 14.0–49.0)
MCHC: 33.1 g/dL (ref 32.0–36.0)
MONO#: 0.7 10*3/uL (ref 0.1–0.9)
NEUT#: 4.9 10*3/uL (ref 1.5–6.5)
RBC: 3.54 10*6/uL — ABNORMAL LOW (ref 4.20–5.82)
RDW: 17.3 % — ABNORMAL HIGH (ref 11.0–14.6)
WBC: 7.4 10*3/uL (ref 4.0–10.3)

## 2012-02-20 LAB — PSA: PSA: 29.38 ng/mL — ABNORMAL HIGH (ref <=4.00)

## 2012-02-20 NOTE — Progress Notes (Signed)
Hematology and Oncology Follow Up Visit  [name] [id number] November 17, 1930 78 y.o. 02/20/2012 10:57 AM   Principle Diagnosis: 78 year old with diagnosis of prostate cancer in 1993. Gleason score 4+3=7.  Prior Therapy: Androgen deprivation. Lupron + casodex upfront therapy.   Current therapy: Lupron only. Recently developed local relapse with rising PSA despite castrate levels testosterone and urinary retention.   Interim History:  78 year old man returns for a follow up visit. I saw him back on 10/1 for an evaluation for rising PSA and urinary retention. Since his last visit, he is S/P TURP on 11/15. The pathology did show more prostate cancer. Foley catheter has been removed and voiding freely. His PSA is up to 136 (from 67) before the TURP.Marland Kitchen Clinically, [name] reports feeling relatively fair. He is relatively frail at times. He is able to ambulate with the help of a cane and does not drive. He does not have any back pain, shoulder pain. He does not really report any decline in his energy or performance status. He has, again, urinary retention as his biggest issue and again he is on some tamsulosin as well as an indwelling He is eating better and have gained one lb at this time.   Medications: I have reviewed the patient's current medications. Current outpatient prescriptions:cephALEXin (KEFLEX) 500 MG capsule, Take 1 capsule (500 mg total) by mouth daily., Disp: 7 capsule, Rfl: 0;  HYDROcodone-acetaminophen (NORCO) 5-325 MG per tablet, Take 1 tablet by mouth 2 (two) times daily. , Disp: , Rfl: ;  hydrocortisone (ANUSOL-HC) 25 MG suppository, Place 25 mg rectally as needed. , Disp: , Rfl: ;  hydroxychloroquine (PLAQUENIL) 200 MG tablet, Take 200 mg by mouth at bedtime. , Disp: , Rfl:  leuprolide (LUPRON) 30 MG injection, Inject 30 mg into the muscle every 6 (six) months. , Disp: , Rfl: ;  methotrexate (RHEUMATREX) 2.5 MG tablet, Take 2.5 mg by mouth once a week. Caution:Chemotherapy. Protect  from light.4 TABS ONCE WEEKLY Sunday, Disp: , Rfl: ;  pravastatin (PRAVACHOL) 40 MG tablet, Take 40 mg by mouth at bedtime. , Disp: , Rfl: ;  predniSONE (DELTASONE) 5 MG tablet, Take 5 mg by mouth at bedtime. , Disp: , Rfl:   Allergies:  Allergies  Allergen Reactions  . Ciprofloxacin Diarrhea    Severe diarrhea/colitis    Past Medical History, Surgical history, Social history, and Family History were reviewed and updated.  Review of Systems: Constitutional:  Negative for fever, chills, night sweats, anorexia, weight loss, pain. Cardiovascular: no chest pain or dyspnea on exertion Respiratory: negative Neurological: negative Dermatological: negative ENT: negative Skin: Negative. Gastrointestinal: negative Genito-Urinary: negative Hematological and Lymphatic: negative Breast: negative Musculoskeletal: negative Remaining ROS negative. Physical Exam: Blood pressure 145/84, pulse 91, temperature 97.2 F (36.2 C), temperature source Oral, resp. rate 18, height 5\' 11"  (1.803 m), weight 137 lb 3.2 oz (62.234 kg). ECOG: 1 General appearance: alert Head: Normocephalic, without obvious abnormality, atraumatic Neck: no adenopathy, no carotid bruit, no JVD, supple, symmetrical, trachea midline and thyroid not enlarged, symmetric, no tenderness/mass/nodules Lymph nodes: Cervical, supraclavicular, and axillary nodes normal. Heart:regular rate and rhythm, S1, S2 normal, no murmur, click, rub or gallop Lung:chest clear, no wheezing, rales, normal symmetric air entry Abdomin: soft, non-tender, without masses or organomegaly EXT:no erythema, induration, or nodules   Lab Results: Lab Results  Component Value Date   WBC 7.4 02/20/2012   HGB 10.9* 02/20/2012   HCT 33.1* 02/20/2012   MCV 93.4 02/20/2012   PLT  231 02/20/2012     Chemistry      Component Value Date/Time   NA 138 12/09/2011 1334   NA 140 02/23/2011 1150   K 3.8 12/09/2011 1334   K 3.3* 02/23/2011 1150   CL 105 12/09/2011  1334   CL 105 02/23/2011 1150   CO2 22 12/09/2011 1334   CO2 23 02/23/2011 1150   BUN 15.0 12/09/2011 1334   BUN 17 02/23/2011 1150   CREATININE 0.8 12/09/2011 1334   CREATININE 0.80 02/23/2011 1150      Component Value Date/Time   CALCIUM 9.2 12/09/2011 1334   CALCIUM 9.5 02/23/2011 1150   ALKPHOS 104 12/09/2011 1334   ALKPHOS 60 02/23/2011 1150   AST 23 12/09/2011 1334   AST 15 02/23/2011 1150   ALT 13 12/09/2011 1334   ALT 7 02/23/2011 1150   BILITOT 1.00 12/09/2011 1334   BILITOT 1.7* 02/23/2011 1150      Impression and Plan:  This is a pleasant 78 year old gentleman with the  following issues.  1. Prostate cancer. He stated he was diagnosed dating back to 45. He had a Gleason score of 7. His PSA is up to 136 which has doubled in about 4 months. He does have local relapse and urinary retention. He is S/P TURP by Dr. Mena Goes on 11/15. I will evaluate his PSA at this time. If it is going down at this time, I will hold off on any systemic therapy. If his PSA continues to go up, then we can start systemic therapy. I discussed Zytiga as a possible agent. Risks and benefits discussed and written information given to his daughter today.    2. Androgen deprivation: he will continue on Lupron.  3. Follow up in 1 months.    Javier Polyakov, MD 12/13/201310:57 AM

## 2012-02-20 NOTE — Telephone Encounter (Signed)
gv and printed appt schedule for pt for Jan 2014..in the orders tab.Marland KitchenMarland Kitchen

## 2012-03-30 ENCOUNTER — Other Ambulatory Visit (HOSPITAL_BASED_OUTPATIENT_CLINIC_OR_DEPARTMENT_OTHER): Payer: Medicare Other | Admitting: Lab

## 2012-03-30 DIAGNOSIS — C61 Malignant neoplasm of prostate: Secondary | ICD-10-CM

## 2012-03-30 LAB — CBC WITH DIFFERENTIAL/PLATELET
Basophils Absolute: 0 10*3/uL (ref 0.0–0.1)
Eosinophils Absolute: 0.1 10*3/uL (ref 0.0–0.5)
HGB: 10.9 g/dL — ABNORMAL LOW (ref 13.0–17.1)
MONO#: 0.7 10*3/uL (ref 0.1–0.9)
NEUT#: 4.6 10*3/uL (ref 1.5–6.5)
RBC: 3.66 10*6/uL — ABNORMAL LOW (ref 4.20–5.82)
RDW: 17.3 % — ABNORMAL HIGH (ref 11.0–14.6)
WBC: 7.2 10*3/uL (ref 4.0–10.3)

## 2012-03-30 LAB — COMPREHENSIVE METABOLIC PANEL (CC13)
Albumin: 3.5 g/dL (ref 3.5–5.0)
Alkaline Phosphatase: 92 U/L (ref 40–150)
BUN: 15 mg/dL (ref 7.0–26.0)
CO2: 27 mEq/L (ref 22–29)
Calcium: 9.1 mg/dL (ref 8.4–10.4)
Chloride: 105 mEq/L (ref 98–107)
Glucose: 86 mg/dl (ref 70–99)
Potassium: 3.6 mEq/L (ref 3.5–5.1)
Sodium: 141 mEq/L (ref 136–145)
Total Protein: 7.3 g/dL (ref 6.4–8.3)

## 2012-03-30 LAB — PSA: PSA: 31.4 ng/mL — ABNORMAL HIGH (ref <=4.00)

## 2012-04-30 ENCOUNTER — Other Ambulatory Visit: Payer: Self-pay | Admitting: Oncology

## 2012-04-30 ENCOUNTER — Telehealth: Payer: Self-pay | Admitting: Oncology

## 2012-04-30 DIAGNOSIS — C61 Malignant neoplasm of prostate: Secondary | ICD-10-CM

## 2012-04-30 NOTE — Telephone Encounter (Signed)
s.w. pt and advised on April appt °

## 2012-05-31 ENCOUNTER — Other Ambulatory Visit: Payer: Self-pay | Admitting: *Deleted

## 2012-05-31 DIAGNOSIS — G893 Neoplasm related pain (acute) (chronic): Secondary | ICD-10-CM

## 2012-05-31 MED ORDER — HYDROCODONE-ACETAMINOPHEN 5-325 MG PO TABS: 1.0000 | ORAL_TABLET | Freq: Four times a day (QID) | ORAL | Status: DC | PRN

## 2012-06-11 ENCOUNTER — Ambulatory Visit (HOSPITAL_BASED_OUTPATIENT_CLINIC_OR_DEPARTMENT_OTHER): Payer: Medicare Other | Admitting: Oncology

## 2012-06-11 ENCOUNTER — Telehealth: Payer: Self-pay | Admitting: Oncology

## 2012-06-11 ENCOUNTER — Other Ambulatory Visit (HOSPITAL_BASED_OUTPATIENT_CLINIC_OR_DEPARTMENT_OTHER): Payer: Medicare Other | Admitting: Lab

## 2012-06-11 VITALS — BP 153/87 | HR 98 | Temp 97.3°F | Wt 134.6 lb

## 2012-06-11 DIAGNOSIS — C61 Malignant neoplasm of prostate: Secondary | ICD-10-CM

## 2012-06-11 DIAGNOSIS — R339 Retention of urine, unspecified: Secondary | ICD-10-CM

## 2012-06-11 DIAGNOSIS — E291 Testicular hypofunction: Secondary | ICD-10-CM

## 2012-06-11 LAB — CBC WITH DIFFERENTIAL/PLATELET
BASO%: 1.1 % (ref 0.0–2.0)
LYMPH%: 27 % (ref 14.0–49.0)
MCHC: 32.7 g/dL (ref 32.0–36.0)
MONO#: 0.7 10*3/uL (ref 0.1–0.9)
NEUT#: 3.9 10*3/uL (ref 1.5–6.5)
Platelets: 224 10*3/uL (ref 140–400)
RBC: 3.61 10*6/uL — ABNORMAL LOW (ref 4.20–5.82)
RDW: 17.9 % — ABNORMAL HIGH (ref 11.0–14.6)
WBC: 6.6 10*3/uL (ref 4.0–10.3)

## 2012-06-11 LAB — COMPREHENSIVE METABOLIC PANEL (CC13)
ALT: 7 U/L (ref 0–55)
Albumin: 3.3 g/dL — ABNORMAL LOW (ref 3.5–5.0)
Alkaline Phosphatase: 106 U/L (ref 40–150)
CO2: 27 mEq/L (ref 22–29)
Potassium: 4 mEq/L (ref 3.5–5.1)
Sodium: 139 mEq/L (ref 136–145)
Total Bilirubin: 0.91 mg/dL (ref 0.20–1.20)
Total Protein: 7.1 g/dL (ref 6.4–8.3)

## 2012-06-11 LAB — PSA: PSA: 80.11 ng/mL — ABNORMAL HIGH (ref <=4.00)

## 2012-06-11 NOTE — Progress Notes (Signed)
Hematology and Oncology Follow Up Visit  [name] [id number] 1930/11/30 79 y.o. 06/11/2012 2:22 PM   Principle Diagnosis: 79 year old with diagnosis of prostate cancer in 1993. Gleason score 4+3=7.  Prior Therapy: Androgen deprivation. Lupron + casodex upfront therapy.   Current therapy: Lupron only. Recently developed local relapse with rising PSA despite castrate levels testosterone and urinary retention.   Interim History:  78 year old man returns for a follow up visit. He is S/P TURP on 11/15 due to urinary retention. The pathology did show more prostate cancer. Foley catheter has been removed and uses self catheterization. His PSA is up to 136 (from 67) before the TURP bur dropped down to around 30. Clinically, Mr. Granlund reports feeling relatively fair. He is relatively frail at times. He is able to ambulate with the help of a cane and does not drive. He does not have any back pain, shoulder pain. He does not really report any decline in his energy or performance status. He has, again, urinary retention as his biggest issue at this time.  He not reporting any back pain at this time.   Medications: I have reviewed the patient's current medications. Current outpatient prescriptions:cephALEXin (KEFLEX) 500 MG capsule, Take 1 capsule (500 mg total) by mouth daily., Disp: 7 capsule, Rfl: 0;  HYDROcodone-acetaminophen (NORCO/VICODIN) 5-325 MG per tablet, Take 1 tablet by mouth every 6 (six) hours as needed for pain., Disp: 120 tablet, Rfl: 0;  hydrocortisone (ANUSOL-HC) 25 MG suppository, Place 25 mg rectally as needed. , Disp: , Rfl:  hydroxychloroquine (PLAQUENIL) 200 MG tablet, Take 200 mg by mouth at bedtime. , Disp: , Rfl: ;  leuprolide (LUPRON) 30 MG injection, Inject 30 mg into the muscle every 6 (six) months. , Disp: , Rfl: ;  methotrexate (RHEUMATREX) 2.5 MG tablet, Take 2.5 mg by mouth once a week. Caution:Chemotherapy. Protect from light.4 TABS ONCE WEEKLY Sunday, Disp: , Rfl: ;   pravastatin (PRAVACHOL) 40 MG tablet, Take 40 mg by mouth at bedtime. , Disp: , Rfl:  predniSONE (DELTASONE) 5 MG tablet, Take 5 mg by mouth at bedtime. , Disp: , Rfl:   Allergies:  Allergies  Allergen Reactions  . Ciprofloxacin Diarrhea    Severe diarrhea/colitis    Past Medical History, Surgical history, Social history, and Family History were reviewed and updated.  Review of Systems: Constitutional:  Negative for fever, chills, night sweats, anorexia, weight loss, pain. Cardiovascular: no chest pain or dyspnea on exertion Respiratory: negative Neurological: negative Dermatological: negative ENT: negative Skin: Negative. Gastrointestinal: negative Genito-Urinary: negative Hematological and Lymphatic: negative Breast: negative Musculoskeletal: negative Remaining ROS negative. Physical Exam: There were no vitals taken for this visit. ECOG: 1 General appearance: alert Head: Normocephalic, without obvious abnormality, atraumatic Neck: no adenopathy, no carotid bruit, no JVD, supple, symmetrical, trachea midline and thyroid not enlarged, symmetric, no tenderness/mass/nodules Lymph nodes: Cervical, supraclavicular, and axillary nodes normal. Heart:regular rate and rhythm, S1, S2 normal, no murmur, click, rub or gallop Lung:chest clear, no wheezing, rales, normal symmetric air entry Abdomin: soft, non-tender, without masses or organomegaly EXT:no erythema, induration, or nodules   Lab Results: Lab Results  Component Value Date   WBC 6.6 06/11/2012   HGB 10.8* 06/11/2012   HCT 33.0* 06/11/2012   MCV 91.3 06/11/2012   PLT 224 06/11/2012     Chemistry      Component Value Date/Time   NA 141 03/30/2012 1008   NA 140 02/23/2011 1150   K 3.6 03/30/2012 1008   K  3.3* 02/23/2011 1150   CL 105 03/30/2012 1008   CL 105 02/23/2011 1150   CO2 27 03/30/2012 1008   CO2 23 02/23/2011 1150   BUN 15.0 03/30/2012 1008   BUN 17 02/23/2011 1150   CREATININE 0.7 03/30/2012 1008   CREATININE 0.80  02/23/2011 1150      Component Value Date/Time   CALCIUM 9.1 03/30/2012 1008   CALCIUM 9.5 02/23/2011 1150   ALKPHOS 92 03/30/2012 1008   ALKPHOS 60 02/23/2011 1150   AST 20 03/30/2012 1008   AST 15 02/23/2011 1150   ALT 13 03/30/2012 1008   ALT 7 02/23/2011 1150   BILITOT 1.00 03/30/2012 1008   BILITOT 1.7* 02/23/2011 1150     Results for Javier, Rodgers (MRN [id number]) as of 06/11/2012 14:05  Ref. Range 12/09/2011 13:34 02/20/2012 10:09 03/30/2012 10:08  PSA Latest Range: <=4.00 ng/mL 138.30 (H) 29.38 (H) 31.40 (H)   Impression and Plan:  This is a pleasant 79 year old gentleman with the  following issues.  1. Prostate cancer. He stated he was diagnosed dating back to 33. He had a Gleason score of 7. His PSA is up to 136 which has doubled in about 4 months. He does have local relapse and urinary retention. He is S/P TURP by Dr. Mena Goes on 11/15 with PSA down to 31.  He is a questionable L2 lesion.  The plan is to recheck his PSA and repeat his bone scan. If he has clear evidence of systemic disease, I will start him on Zytiga.   2. Androgen deprivation: he will continue on Lupron.  3. Follow up in 6 weeks.    [facility], MD 4/4/20142:22 PM

## 2012-06-11 NOTE — Telephone Encounter (Signed)
gv and printed appt schedule for May...pt aware cs will call with d/t of scan

## 2012-06-28 ENCOUNTER — Telehealth: Payer: Self-pay | Admitting: Family Medicine

## 2012-06-28 DIAGNOSIS — G893 Neoplasm related pain (acute) (chronic): Secondary | ICD-10-CM

## 2012-06-28 NOTE — Telephone Encounter (Signed)
This will be okay for age usual amount

## 2012-06-29 ENCOUNTER — Other Ambulatory Visit: Payer: Self-pay | Admitting: *Deleted

## 2012-06-29 ENCOUNTER — Other Ambulatory Visit: Payer: Self-pay | Admitting: Family Medicine

## 2012-06-29 DIAGNOSIS — G893 Neoplasm related pain (acute) (chronic): Secondary | ICD-10-CM

## 2012-06-29 MED ORDER — HYDROCODONE-ACETAMINOPHEN 5-325 MG PO TABS: 1.0000 | ORAL_TABLET | Freq: Four times a day (QID) | ORAL | Status: DC | PRN

## 2012-06-29 NOTE — Telephone Encounter (Signed)
RX printed and given to pt 

## 2012-07-15 ENCOUNTER — Other Ambulatory Visit: Payer: Self-pay

## 2012-07-15 MED ORDER — PREDNISONE 5 MG PO TABS: 5.0000 mg | ORAL_TABLET | Freq: Every day | ORAL | Status: DC

## 2012-07-16 ENCOUNTER — Telehealth: Payer: Self-pay | Admitting: *Deleted

## 2012-07-16 NOTE — Telephone Encounter (Signed)
Pt aware that prednisone rx per dwm was printed out and phoned into [facility]

## 2012-07-20 ENCOUNTER — Encounter (HOSPITAL_COMMUNITY)
Admission: RE | Admit: 2012-07-20 | Discharge: 2012-07-20 | Disposition: A | Discharge status: Home / Self Care | Payer: Medicare Other | Source: Ambulatory Visit | Attending: Oncology | Admitting: Oncology

## 2012-07-20 ENCOUNTER — Encounter (HOSPITAL_COMMUNITY): Payer: Self-pay

## 2012-07-20 ENCOUNTER — Other Ambulatory Visit (HOSPITAL_BASED_OUTPATIENT_CLINIC_OR_DEPARTMENT_OTHER): Payer: Medicare Other | Admitting: Lab

## 2012-07-20 ENCOUNTER — Other Ambulatory Visit: Payer: Self-pay | Admitting: *Deleted

## 2012-07-20 DIAGNOSIS — C61 Malignant neoplasm of prostate: Secondary | ICD-10-CM | POA: Insufficient documentation

## 2012-07-20 DIAGNOSIS — M069 Rheumatoid arthritis, unspecified: Secondary | ICD-10-CM | POA: Insufficient documentation

## 2012-07-20 DIAGNOSIS — C7951 Secondary malignant neoplasm of bone: Secondary | ICD-10-CM | POA: Insufficient documentation

## 2012-07-20 LAB — CBC WITH DIFFERENTIAL/PLATELET
Basophils Absolute: 0 10*3/uL (ref 0.0–0.1)
Eosinophils Absolute: 0.1 10*3/uL (ref 0.0–0.5)
HGB: 11.3 g/dL — ABNORMAL LOW (ref 13.0–17.1)
MCV: 91 fL (ref 79.3–98.0)
MONO#: 0.6 10*3/uL (ref 0.1–0.9)
MONO%: 8.1 % (ref 0.0–14.0)
NEUT#: 5.7 10*3/uL (ref 1.5–6.5)
Platelets: 229 10*3/uL (ref 140–400)
RDW: 18.5 % — ABNORMAL HIGH (ref 11.0–14.6)
WBC: 7.6 10*3/uL (ref 4.0–10.3)

## 2012-07-20 LAB — COMPREHENSIVE METABOLIC PANEL (CC13)
Albumin: 3.5 g/dL (ref 3.5–5.0)
Alkaline Phosphatase: 182 U/L — ABNORMAL HIGH (ref 40–150)
BUN: 15.5 mg/dL (ref 7.0–26.0)
CO2: 25 mEq/L (ref 22–29)
Calcium: 9.2 mg/dL (ref 8.4–10.4)
Glucose: 94 mg/dl (ref 70–99)
Potassium: 3.9 mEq/L (ref 3.5–5.1)
Sodium: 141 mEq/L (ref 136–145)
Total Protein: 7.5 g/dL (ref 6.4–8.3)

## 2012-07-20 LAB — PSA: PSA: 113.1 ng/mL — ABNORMAL HIGH (ref <=4.00)

## 2012-07-20 MED ORDER — TECHNETIUM TC 99M MEDRONATE IV KIT
25.0000 | PACK | Freq: Once | INTRAVENOUS | Status: AC | PRN
  Administered 2012-07-20: 25 via INTRAVENOUS

## 2012-07-20 MED ORDER — HYDROXYCHLOROQUINE SULFATE 200 MG PO TABS: 200.0000 mg | ORAL_TABLET | Freq: Every day | ORAL | Status: DC

## 2012-07-22 ENCOUNTER — Encounter: Payer: Self-pay | Admitting: Oncology

## 2012-07-22 ENCOUNTER — Ambulatory Visit (HOSPITAL_BASED_OUTPATIENT_CLINIC_OR_DEPARTMENT_OTHER): Payer: Medicare Other | Admitting: Oncology

## 2012-07-22 ENCOUNTER — Telehealth: Payer: Self-pay | Admitting: Oncology

## 2012-07-22 VITALS — BP 155/79 | HR 106 | Temp 97.9°F | Resp 20 | Ht 71.0 in | Wt 139.0 lb

## 2012-07-22 DIAGNOSIS — C61 Malignant neoplasm of prostate: Secondary | ICD-10-CM

## 2012-07-22 DIAGNOSIS — C7951 Secondary malignant neoplasm of bone: Secondary | ICD-10-CM

## 2012-07-22 MED ORDER — ABIRATERONE ACETATE 250 MG PO TABS: 1000.0000 mg | ORAL_TABLET | Freq: Every day | ORAL | Status: DC

## 2012-07-22 NOTE — Telephone Encounter (Signed)
gv pt appt schedule for May and June.  °

## 2012-07-22 NOTE — Progress Notes (Signed)
Faxed zytiga prescription to [facility] °

## 2012-07-22 NOTE — Progress Notes (Signed)
Hematology and Oncology Follow Up Visit  [name] [id number] 09/09/1930 79 y.o. 07/22/2012 9:50 AM   Principle Diagnosis: 79 year old with diagnosis of prostate cancer in 1993. Gleason score 4+3=7. 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   Prior Therapy: Androgen deprivation. Lupron + casodex upfront therapy.   Current therapy: Lupron only. Recently developed local relapse with rising PSA despite castrate levels testosterone and urinary retention.  He is about to start systemic therapy.   Interim History: [name] is a 79 year old man returns for a follow up visit. He is S/P TURP on 11/15 due to urinary retention. The pathology did show more prostate cancer. Foley catheter has been removed and uses self catheterization. His PSA is going up after a short period of decline after a TURP.  Clinically, [name] reports feeling relatively fair. He is relatively frail at times. He is able to ambulate with the help of a cane and does not drive. He does not have any back pain, shoulder pain. He does not really report any decline in his energy or performance status. He has, again, urinary retention as his biggest issue at this time.  He reports lower leg pain with ambulation.   Medications: I have reviewed the patient's current medications.  Current Outpatient Prescriptions  Medication Sig Dispense Refill  . abiraterone Acetate (ZYTIGA) 250 MG tablet Take 4 tablets (1,000 mg total) by mouth daily. Take on an empty stomach 1 hour before or 2 hours after a meal  120 tablet  0  . cephALEXin (KEFLEX) 500 MG capsule Take 1 capsule (500 mg total) by mouth daily.  7 capsule  0  . HYDROcodone-acetaminophen (NORCO/VICODIN) 5-325 MG per tablet Take 1 tablet by mouth every 6 (six) hours as needed for pain.  120 tablet  2  . hydrocortisone (ANUSOL-HC) 25 MG suppository Place 25 mg rectally as needed.       . hydroxychloroquine (PLAQUENIL) 200 MG tablet Take 1 tablet (200 mg total)  by mouth daily.  30 tablet  1  . leuprolide (LUPRON) 30 MG injection Inject 30 mg into the muscle every 6 (six) months.       . methotrexate (RHEUMATREX) 2.5 MG tablet Take 2.5 mg by mouth once a week. Caution:Chemotherapy. Protect from light.4 TABS ONCE WEEKLY Sunday      . pravastatin (PRAVACHOL) 40 MG tablet Take 40 mg by mouth at bedtime.       . predniSONE (DELTASONE) 5 MG tablet Take 1 tablet (5 mg total) by mouth at bedtime.  30 tablet  2   No current facility-administered medications for this visit.    Allergies:  Allergies  Allergen Reactions  . Ciprofloxacin Diarrhea    Severe diarrhea/colitis    Past Medical History, Surgical history, Social history, and Family History were reviewed and updated.  Review of Systems: Constitutional:  Negative for fever, chills, night sweats, anorexia, weight loss, pain. Cardiovascular: no chest pain or dyspnea on exertion Respiratory: negative Neurological: negative Dermatological: negative ENT: negative Skin: Negative. Gastrointestinal: negative Genito-Urinary: negative Hematological and Lymphatic: negative Breast: negative Musculoskeletal: negative Remaining ROS negative. Physical Exam: Blood pressure 155/79, pulse 106, temperature 97.9 F (36.6 C), temperature source Oral, resp. rate 20, height 5\' 11"  (1.803 m), weight 139 lb (63.05 kg). ECOG: 1 General appearance: alert Head: Normocephalic, without obvious abnormality, atraumatic Neck: no adenopathy, no carotid bruit, no JVD, supple, symmetrical, trachea midline and thyroid not enlarged, symmetric, no tenderness/mass/nodules Lymph nodes: Cervical, supraclavicular, and axillary  nodes normal. Heart:regular rate and rhythm, S1, S2 normal, no murmur, click, rub or gallop Lung:chest clear, no wheezing, rales, normal symmetric air entry Abdomin: soft, non-tender, without masses or organomegaly EXT:no erythema, induration, or nodules   Lab Results: Lab Results  Component Value  Date   WBC 7.6 07/20/2012   HGB 11.3* 07/20/2012   HCT 34.8* 07/20/2012   MCV 91.0 07/20/2012   PLT 229 07/20/2012     Chemistry      Component Value Date/Time   NA 141 07/20/2012 0954   NA 140 02/23/2011 1150   K 3.9 07/20/2012 0954   K 3.3* 02/23/2011 1150   CL 106 07/20/2012 0954   CL 105 02/23/2011 1150   CO2 25 07/20/2012 0954   CO2 23 02/23/2011 1150   BUN 15.5 07/20/2012 0954   BUN 17 02/23/2011 1150   CREATININE 0.7 07/20/2012 0954   CREATININE 0.80 02/23/2011 1150      Component Value Date/Time   CALCIUM 9.2 07/20/2012 0954   CALCIUM 9.5 02/23/2011 1150   ALKPHOS 182* 07/20/2012 0954   ALKPHOS 60 02/23/2011 1150   AST 17 07/20/2012 0954   AST 15 02/23/2011 1150   ALT 8 07/20/2012 0954   ALT 7 02/23/2011 1150   BILITOT 0.93 07/20/2012 0954   BILITOT 1.7* 02/23/2011 1150     Results for [name] (MRN [id number]) as of 07/22/2012 09:30  Ref. Range 03/30/2012 10:08 06/11/2012 13:58 07/20/2012 09:54  PSA Latest Range: <=4.00 ng/mL 31.40 (H) 80.11 (H) 113.10 (H)   NUCLEAR MEDICINE WHOLE BODY BONE SCINTIGRAPHY  Technique: Whole body anterior and posterior images were obtained  approximately 3 hours after intravenous injection of  radiopharmaceutical.  Radiopharmaceutical: CURIE TC-MDP TECHNETIUM TC 68M  MEDRONATE IV KIT  Comparison: 08/28/2011  Findings: Multiple bony metastases are now identified throughout  the spine, bony pelvis, ribs, proximal femurs, and bony calvarium.  This is significantly increased since the prior study. Mild  degenerative changes are noted within the shoulders.  IMPRESSION:  Significant progression of bony metastatic disease throughout the  axial and proximal appendicular skeleton.   Impression and Plan:  This is a pleasant 79 year old gentleman with the  following issues.  1. Prostate cancer. He stated he was diagnosed dating back to 12. He had a Gleason score of 7. His PSA is up to 136 which has doubled in about 4 months. He does  have local relapse and urinary retention. He is S/P TURP by Dr. Mena Rodgers on 11/15 with PSA down to 31.  Since then he had a sharp rise in his PSA with bone mets on his bone scan done on 5/13 and was reviewed today.  I discussed again the risks and benefits of Zytiga vs chemotherapy and he is agreeable to start Zytiga.  Written information given to the patient and he will be referred to chemo class.   2. Androgen deprivation: he will continue on Lupron.  3. Bone health: he is on calcium and vitamin D. I will get prior authorization for Xgeva to be given with the next visit. Risks and benefits discussed including dental complications.   4. Follow up in 5 weeks.    [facility], MD 5/15/20149:50 AM

## 2012-07-22 NOTE — Progress Notes (Signed)
Script for zytiga given to [employer],  in managed care, for financial assistance.

## 2012-07-23 ENCOUNTER — Encounter: Payer: Self-pay | Admitting: Oncology

## 2012-07-23 NOTE — Progress Notes (Signed)
calle PAN and patient approved for Zytiga 07/23/12-07/22/12. I will forward to medical records.

## 2012-07-27 ENCOUNTER — Encounter: Payer: Self-pay | Admitting: *Deleted

## 2012-07-27 ENCOUNTER — Other Ambulatory Visit: Payer: Medicare Other

## 2012-07-27 NOTE — Progress Notes (Signed)
RECEIVED A FAX FROM [facility] CONCERNING A CONFIRMATION OF PRESCRIPTION SHIPMENT FOR ZYTIGA ON 07/26/12. 

## 2012-08-09 ENCOUNTER — Other Ambulatory Visit: Payer: Self-pay

## 2012-08-09 MED ORDER — METHOTREXATE 2.5 MG PO TABS: 2.5000 mg | ORAL_TABLET | ORAL | Status: DC

## 2012-08-09 MED ORDER — PRAVASTATIN SODIUM 40 MG PO TABS: 40.0000 mg | ORAL_TABLET | Freq: Every day | ORAL | Status: DC

## 2012-08-11 ENCOUNTER — Other Ambulatory Visit: Payer: Self-pay

## 2012-08-11 MED ORDER — METHOTREXATE 2.5 MG PO TABS: 2.5000 mg | ORAL_TABLET | ORAL | Status: DC

## 2012-08-16 ENCOUNTER — Telehealth: Payer: Self-pay | Admitting: Family Medicine

## 2012-08-17 NOTE — Telephone Encounter (Signed)
Called pharmacy and they said it was already fixed

## 2012-08-19 ENCOUNTER — Other Ambulatory Visit: Payer: Self-pay | Admitting: *Deleted

## 2012-08-19 ENCOUNTER — Encounter: Payer: Self-pay | Admitting: *Deleted

## 2012-08-19 MED ORDER — ABIRATERONE ACETATE 250 MG PO TABS: 1000.0000 mg | ORAL_TABLET | Freq: Every day | ORAL | Status: DC

## 2012-08-19 NOTE — Telephone Encounter (Signed)
THIS REFILL REQUEST FOR ZYTIGA WAS PLACED IN DR.SHADAD'S ACTIVE WORK FOLDER. 

## 2012-08-20 ENCOUNTER — Encounter: Payer: Self-pay | Admitting: Family Medicine

## 2012-08-25 ENCOUNTER — Other Ambulatory Visit: Payer: Self-pay | Admitting: *Deleted

## 2012-08-25 NOTE — Telephone Encounter (Signed)
Prescription Zytiga shipped 08/24/12

## 2012-08-30 ENCOUNTER — Ambulatory Visit: Payer: Self-pay | Admitting: Family Medicine

## 2012-08-31 ENCOUNTER — Telehealth: Payer: Self-pay | Admitting: Oncology

## 2012-08-31 ENCOUNTER — Encounter: Payer: Self-pay | Admitting: Oncology

## 2012-08-31 ENCOUNTER — Ambulatory Visit (HOSPITAL_BASED_OUTPATIENT_CLINIC_OR_DEPARTMENT_OTHER): Payer: Medicare Other | Admitting: Oncology

## 2012-08-31 ENCOUNTER — Other Ambulatory Visit (HOSPITAL_BASED_OUTPATIENT_CLINIC_OR_DEPARTMENT_OTHER): Payer: Medicare Other | Admitting: Lab

## 2012-08-31 VITALS — BP 152/93 | HR 107 | Temp 97.2°F | Resp 20 | Ht 71.0 in | Wt 137.9 lb

## 2012-08-31 DIAGNOSIS — C61 Malignant neoplasm of prostate: Secondary | ICD-10-CM

## 2012-08-31 DIAGNOSIS — C7951 Secondary malignant neoplasm of bone: Secondary | ICD-10-CM

## 2012-08-31 DIAGNOSIS — C7952 Secondary malignant neoplasm of bone marrow: Secondary | ICD-10-CM

## 2012-08-31 LAB — CBC WITH DIFFERENTIAL/PLATELET
BASO%: 0.4 % (ref 0.0–2.0)
Eosinophils Absolute: 0.1 10*3/uL (ref 0.0–0.5)
LYMPH%: 17.1 % (ref 14.0–49.0)
MCHC: 32.9 g/dL (ref 32.0–36.0)
MONO#: 0.4 10*3/uL (ref 0.1–0.9)
NEUT#: 6.1 10*3/uL (ref 1.5–6.5)
Platelets: 219 10*3/uL (ref 140–400)
RBC: 3.79 10*6/uL — ABNORMAL LOW (ref 4.20–5.82)
RDW: 17.5 % — ABNORMAL HIGH (ref 11.0–14.6)
WBC: 8 10*3/uL (ref 4.0–10.3)
lymph#: 1.4 10*3/uL (ref 0.9–3.3)

## 2012-08-31 LAB — COMPREHENSIVE METABOLIC PANEL (CC13)
ALT: 18 U/L (ref 0–55)
Albumin: 3.3 g/dL — ABNORMAL LOW (ref 3.5–5.0)
CO2: 25 mEq/L (ref 22–29)
Glucose: 105 mg/dl — ABNORMAL HIGH (ref 70–99)
Potassium: 3.7 mEq/L (ref 3.5–5.1)
Sodium: 138 mEq/L (ref 136–145)
Total Bilirubin: 1.09 mg/dL (ref 0.20–1.20)
Total Protein: 7.3 g/dL (ref 6.4–8.3)

## 2012-08-31 LAB — PSA: PSA: 118.7 ng/mL — ABNORMAL HIGH (ref <=4.00)

## 2012-08-31 MED ORDER — DENOSUMAB 120 MG/1.7ML ~~LOC~~ SOLN
120.0000 mg | Freq: Once | SUBCUTANEOUS | Status: DC
  Filled 2012-08-31: qty 1.7

## 2012-08-31 NOTE — Telephone Encounter (Signed)
Gave pt appt for July 2014 MD and lab

## 2012-08-31 NOTE — Progress Notes (Signed)
Hematology and Oncology Follow Up Visit  KAMRYN GAUTHIER [id number] Feb 21, 1931 79 y.o. 08/31/2012 1:26 PM   Principle Diagnosis: 79 year old with diagnosis of prostate cancer in 1993. Gleason score 4+3=7. 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   Prior Therapy: Androgen deprivation. Lupron + casodex upfront therapy.   Current therapy: Lupron given at [facility]. He started Zytiga 1000 mg daily in May 2014.   Interim History: Mr. Rothgeb is a 79 year old man returns for a follow up visit. He is S/P TURP on 11/15 due to urinary retention. The pathology did show more prostate cancer. Foley catheter has been removed and uses self catheterization. His PSA is going up after a short period of decline after a TURP.  The patient started Zytiga in May 2014 due to rising PSA and progression of bony metastatic disease on his bone scan in May 2014. Clinically, Mr. Selley reports feeling relatively fair. He is relatively frail at times. He is able to ambulate with the help of a cane and does not drive. He has mild fatigue. He does not have any back pain, shoulder pain. He does not really report any decline in his energy or performance status. He has, again, urinary retention as his biggest issue at this time. He reports lower leg pain with ambulation. Denies abdominal pain, nausea, vomiting.  Medications: I have reviewed the patient's current medications.  Current Outpatient Prescriptions  Medication Sig Dispense Refill  . abiraterone Acetate (ZYTIGA) 250 MG tablet Take 4 tablets (1,000 mg total) by mouth daily. Take on an empty stomach 1 hour before or 2 hours after a meal  120 tablet  0  . cephALEXin (KEFLEX) 500 MG capsule Take 1 capsule (500 mg total) by mouth daily.  7 capsule  0  . HYDROcodone-acetaminophen (NORCO/VICODIN) 5-325 MG per tablet Take 1 tablet by mouth every 6 (six) hours as needed for pain.  120 tablet  2  . hydrocortisone (ANUSOL-HC) 25 MG suppository Place 25  mg rectally as needed.       . hydroxychloroquine (PLAQUENIL) 200 MG tablet Take 1 tablet (200 mg total) by mouth daily.  30 tablet  1  . leuprolide (LUPRON) 30 MG injection Inject 30 mg into the muscle every 6 (six) months.       . methotrexate (RHEUMATREX) 2.5 MG tablet Take 1 tablet (2.5 mg total) by mouth once a week. Caution:Chemotherapy. Protect from light.4 TABS ONCE WEEKLY Sunday  4 tablet  1  . pravastatin (PRAVACHOL) 40 MG tablet Take 1 tablet (40 mg total) by mouth at bedtime.  30 tablet  5  . predniSONE (DELTASONE) 5 MG tablet Take 1 tablet (5 mg total) by mouth at bedtime.  30 tablet  2   Current Facility-Administered Medications  Medication Dose Route Frequency Provider Last Rate Last Dose  . denosumab (XGEVA) injection 120 mg  120 mg Subcutaneous Once Benjiman Core, MD        Allergies:  Allergies  Allergen Reactions  . Ciprofloxacin Diarrhea    Severe diarrhea/colitis    Past Medical History, Surgical history, Social history, and Family History were reviewed and updated.  Review of Systems: Constitutional:  Negative for fever, chills, night sweats, anorexia, weight loss, pain. Cardiovascular: no chest pain or dyspnea on exertion Respiratory: negative Neurological: negative Dermatological: negative ENT: negative Skin: Negative. Gastrointestinal: negative Genito-Urinary: negative Hematological and Lymphatic: negative Breast: negative Musculoskeletal: negative Remaining ROS negative.  Physical Exam: Blood pressure 152/93, pulse 107, temperature 97.2  F (36.2 C), temperature source Oral, resp. rate 20, height 5\' 11"  (1.803 m), weight 137 lb 14.4 oz (62.551 kg). ECOG: 1 General appearance: alert Head: Normocephalic, without obvious abnormality, atraumatic Neck: no adenopathy, no carotid bruit, no JVD, supple, symmetrical, trachea midline and thyroid not enlarged, symmetric, no tenderness/mass/nodules Lymph nodes: Cervical, supraclavicular, and axillary nodes  normal. Heart:regular rate and rhythm, S1, S2 normal, no murmur, click, rub or gallop Lung:chest clear, no wheezing, rales, normal symmetric air entry Abdomen: soft, non-tender, without masses or organomegaly EXT:no erythema, induration, or nodules   Lab Results: Lab Results  Component Value Date   WBC 8.0 08/31/2012   HGB 11.3* 08/31/2012   HCT 34.4* 08/31/2012   MCV 90.7 08/31/2012   PLT 219 08/31/2012     Chemistry      Component Value Date/Time   NA 138 08/31/2012 0943   NA 140 02/23/2011 1150   K 3.7 08/31/2012 0943   K 3.3* 02/23/2011 1150   CL 104 08/31/2012 0943   CL 105 02/23/2011 1150   CO2 25 08/31/2012 0943   CO2 23 02/23/2011 1150   BUN 10.7 08/31/2012 0943   BUN 17 02/23/2011 1150   CREATININE 0.7 08/31/2012 0943   CREATININE 0.80 02/23/2011 1150      Component Value Date/Time   CALCIUM 9.2 08/31/2012 0943   CALCIUM 9.5 02/23/2011 1150   ALKPHOS 305 Repeated and Verified* 08/31/2012 0943   ALKPHOS 60 02/23/2011 1150   AST 26 08/31/2012 0943   AST 15 02/23/2011 1150   ALT 18 08/31/2012 0943   ALT 7 02/23/2011 1150   BILITOT 1.09 08/31/2012 0943   BILITOT 1.7* 02/23/2011 1150     Results for LYNWOOD, KUBISIAK (MRN [id number]) as of 08/31/2012 10:19  Ref. Range 12/09/2011 13:34 02/20/2012 10:09 03/30/2012 10:08 06/11/2012 13:58 07/20/2012 09:54  PSA Latest Range: <=4.00 ng/mL 138.30 (H) 29.38 (H) 31.40 (H) 80.11 (H) 113.10 (H)   Impression and Plan:  This is a pleasant 79 year old gentleman with the  following issues.  1. Prostate cancer. He stated he was diagnosed dating back to 41. He had a Gleason score of 7. His PSA is up to 136 which has doubled in about 4 months. He does have local relapse and urinary retention. He is S/P TURP by Dr. Mena Goes on 11/15 with PSA down to 31. Since then he had a sharp rise in his PSA with bone mets on his bone scan done in May 2014. He is currently on Zytiga and tolerating this well. Recommend that he continue Zytiga without dose  modification. PSA is pending today.  2. Androgen deprivation: he will continue on Lupron.  3. Bone health: he is on calcium and vitamin D. the patient has expressed concern regarding the out-of-pocket costs for this medication. We will refer him to the financial counselor to discuss options to help [place] get this medication covered. We will readdress beginning this medication at his next visit.    4. Follow up in 4-5 weeks.    Clenton Pare 6/24/20141:26 PM

## 2012-08-31 NOTE — Patient Instructions (Signed)
Results for JOANN, KULPA (MRN [id number]) as of 08/31/2012 10:19  Ref. Range 12/09/2011 13:34 02/20/2012 10:09 03/30/2012 10:08 06/11/2012 13:58 07/20/2012 09:54  PSA Latest Range: <=4.00 ng/mL 138.30 (H) 29.38 (H) 31.40 (H) 80.11 (H) 113.10 (H)

## 2012-09-03 ENCOUNTER — Telehealth: Payer: Self-pay | Admitting: Family Medicine

## 2012-09-03 MED ORDER — TRAZODONE HCL 50 MG PO TABS: 50.0000 mg | ORAL_TABLET | Freq: Every day | ORAL | Status: AC

## 2012-09-03 NOTE — Telephone Encounter (Signed)
Pt aware of med and med was ordered and sent to [facility]- normal- jhb

## 2012-09-03 NOTE — Telephone Encounter (Signed)
dwm to address sat am

## 2012-09-03 NOTE — Telephone Encounter (Signed)
Try trazodone 50 mg at bedtime #30 refill as needed

## 2012-09-03 NOTE — Telephone Encounter (Signed)
Script printed.  [facility] called and I gave then a verbal order for prescription.

## 2012-09-07 ENCOUNTER — Other Ambulatory Visit: Payer: Self-pay

## 2012-09-07 MED ORDER — PRAVASTATIN SODIUM 40 MG PO TABS: 40.0000 mg | ORAL_TABLET | Freq: Every day | ORAL | Status: DC

## 2012-09-07 NOTE — Progress Notes (Signed)
Modena Slater, RN - "EMS called last night" More Detail >>      "EMS called last night"    Marcell Barlow, RN      Sent: Tue September 07, 2012  9:38 AM    To: Benjiman Core, MD     Due: Tue September 07, 2012 10:00 AM       [name]    MRN: [id number] DOB: 1930-08-13     Pt Home: [phone]        Entered: [phone]               Message  Wife called to report that EMS was called yesterday because patient had increased vomiting and pain, and developed a large left upper arm bruise from shoulder to elbow; EMS assessed patient and VS which were normal; patient was afebrile; EMS told patient that his symptoms were from [facility]; patient eating and drinking this morning, with no incidents of nausea and vomiting; patient refuses to take Zytiga.        Hold Zytiga for now, per Dr. Clelia Croft; reminded patient to keep next appointment.

## 2012-09-08 ENCOUNTER — Other Ambulatory Visit: Payer: Self-pay | Admitting: *Deleted

## 2012-09-08 MED ORDER — METHOTREXATE 2.5 MG PO TABS: ORAL_TABLET | ORAL | Status: DC

## 2012-09-08 NOTE — Telephone Encounter (Signed)
LAST OV 6/14 

## 2012-09-13 ENCOUNTER — Other Ambulatory Visit: Payer: Self-pay

## 2012-09-13 ENCOUNTER — Telehealth: Payer: Self-pay | Admitting: Family Medicine

## 2012-09-13 ENCOUNTER — Ambulatory Visit: Payer: Self-pay | Admitting: Family Medicine

## 2012-09-13 MED ORDER — PRAVASTATIN SODIUM 40 MG PO TABS: 40.0000 mg | ORAL_TABLET | Freq: Every day | ORAL | Status: AC

## 2012-09-15 ENCOUNTER — Other Ambulatory Visit: Payer: Self-pay | Admitting: General Practice

## 2012-09-15 MED ORDER — PREDNISONE 5 MG PO TABS: 5.0000 mg | ORAL_TABLET | Freq: Every day | ORAL | Status: DC

## 2012-09-15 NOTE — Telephone Encounter (Signed)
Script sent to [email or web address]

## 2012-09-16 NOTE — Telephone Encounter (Signed)
Pharmacy has script

## 2012-09-17 ENCOUNTER — Other Ambulatory Visit: Payer: Self-pay | Admitting: *Deleted

## 2012-09-17 DIAGNOSIS — C61 Malignant neoplasm of prostate: Secondary | ICD-10-CM

## 2012-09-17 MED ORDER — ABIRATERONE ACETATE 250 MG PO TABS: 1000.0000 mg | ORAL_TABLET | Freq: Every day | ORAL | Status: DC

## 2012-09-17 NOTE — Telephone Encounter (Signed)
THIS REFILL REQUEST FOR ZYTIGA WAS PLACED IN DR.SHADAD'S ACTIVE WORK FOLDER. 

## 2012-09-17 NOTE — Addendum Note (Signed)
Addended by: Arvilla Meres on: 09/17/2012 02:05 PM   Modules accepted: Orders

## 2012-09-21 ENCOUNTER — Encounter: Payer: Self-pay | Admitting: Family Medicine

## 2012-09-21 ENCOUNTER — Ambulatory Visit (INDEPENDENT_AMBULATORY_CARE_PROVIDER_SITE_OTHER): Payer: Medicare Other | Admitting: Family Medicine

## 2012-09-21 VITALS — BP 146/86 | HR 104 | Temp 97.1°F

## 2012-09-21 DIAGNOSIS — R5383 Other fatigue: Secondary | ICD-10-CM

## 2012-09-21 DIAGNOSIS — G893 Neoplasm related pain (acute) (chronic): Secondary | ICD-10-CM

## 2012-09-21 DIAGNOSIS — E559 Vitamin D deficiency, unspecified: Secondary | ICD-10-CM

## 2012-09-21 DIAGNOSIS — M069 Rheumatoid arthritis, unspecified: Secondary | ICD-10-CM

## 2012-09-21 DIAGNOSIS — E785 Hyperlipidemia, unspecified: Secondary | ICD-10-CM

## 2012-09-21 DIAGNOSIS — M47817 Spondylosis without myelopathy or radiculopathy, lumbosacral region: Secondary | ICD-10-CM

## 2012-09-21 DIAGNOSIS — J449 Chronic obstructive pulmonary disease, unspecified: Secondary | ICD-10-CM

## 2012-09-21 DIAGNOSIS — C61 Malignant neoplasm of prostate: Secondary | ICD-10-CM

## 2012-09-21 LAB — BASIC METABOLIC PANEL WITH GFR
BUN: 13 mg/dL (ref 6–23)
CO2: 26 mEq/L (ref 19–32)
Chloride: 106 mEq/L (ref 96–112)
Glucose, Bld: 87 mg/dL (ref 70–99)
Potassium: 4.6 mEq/L (ref 3.5–5.3)

## 2012-09-21 LAB — HEPATIC FUNCTION PANEL
Albumin: 3.9 g/dL (ref 3.5–5.2)
Alkaline Phosphatase: 408 U/L — ABNORMAL HIGH (ref 39–117)
Indirect Bilirubin: 0.9 mg/dL (ref 0.0–0.9)
Total Bilirubin: 1.1 mg/dL (ref 0.3–1.2)

## 2012-09-21 MED ORDER — HYDROCODONE-ACETAMINOPHEN 5-325 MG PO TABS: 1.0000 | ORAL_TABLET | Freq: Four times a day (QID) | ORAL | Status: AC | PRN

## 2012-09-21 NOTE — Progress Notes (Signed)
  Subjective:    Patient ID: [name], male    DOB: 02-21-1931, 79 y.o.   MRN: [id number]  HPI Patient comes in today with his wife for followup of chronic medical problems which include hyperlipidemia, COPD, rheumatoid arthritis, chronic fatigue, and prostate cancer. He seems to be in good spirits despite his chronic pain in his back. Also see the review of systems. Patient was on medical therapy for metastatic prostate cancer but this had to be discontinued due to side effects. He does self catheterization periodically because of the enlarged prostate. He says he hurts more in her shoulders and his legs appear to be weaker since all of this has transpired over the past several months.   Review of Systems  Constitutional: Positive for fatigue (due to treatment).  HENT: Positive for congestion (slight).   Eyes: Negative.   Respiratory: Negative.   Cardiovascular: Negative.   Gastrointestinal: Negative.   Genitourinary: Positive for frequency (at night). Negative for dysuria.  Musculoskeletal: Positive for back pain and arthralgias (bilateral shoulder pain, leg pain).  Allergic/Immunologic: Negative.   Neurological: Negative.   Psychiatric/Behavioral: Positive for sleep disturbance (occasional, due to urination).       Objective:   Physical Exam BP 146/86  Pulse 104  Temp(Src) 97.1 F (36.2 C) (Oral)  The patient appeared somewhat frail and pale, but alert and oriented to time and place. Speech, behavior and judgement appear normal. Vital signs as documented.  Head exam is unremarkable. No scleral icterus or pallor noted. He had upper and lower dentures in place. Ears nose and throat appeared normal.  Neck is without jugular venous distension, thyromegally, or carotid bruits. Carotid upstrokes are brisk bilaterally. No cervical adenopathy. Lungs are clear anteriorly and posteriorly to auscultation. Normal respiratory effort. Cardiac exam reveals regular rate and rhythm at 84 per  minute. First and second heart sounds normal.  No murmurs, rubs or gallops.  Abdominal exam reveals normal bowl sounds, no masses, no organomegaly and no aortic enlargement. No inguinal adenopathy. There was slight tenderness in the left upper quadrant. There was some gas. Extremities are nonedematous. The right femoral and pedal pulses were diminished. The pedal pulses on the left were diminished. Left femoral pulse was intact. Skin without pallor or jaundice.  Warm and dry, without rash. Neurologic exam reveals normal deep tendon reflexes and normal sensation.          Assessment & Plan:  1. Neoplasm related pain (acute) (chronic) - HYDROcodone-acetaminophen (NORCO/VICODIN) 5-325 MG per tablet; Take 1 tablet by mouth every 6 (six) hours as needed for pain.  Dispense: 120 tablet; Refill: 5  2. Hyperlipemia - NMR Lipoprofile with Lipids - Hepatic function panel  3. Rheumatoid arthritis - BASIC METABOLIC PANEL WITH GFR  4. Fatigue - POCT CBC  5. Vitamin D deficiency - Vitamin D 25 hydroxy  6. COPD (chronic obstructive pulmonary disease)  7. PROSTATE CANCER  8. Spondylosis, lumbosacral  Patient Instructions  Stay is active as possible. Be careful not to fall. Take medications as directed.  Drink plenty of fluids

## 2012-09-21 NOTE — Patient Instructions (Signed)
Stay is active as possible. Be careful not to fall. Take medications as directed.  Drink plenty of fluids

## 2012-09-21 NOTE — Addendum Note (Signed)
Addended by: Orma Render F on: 09/21/2012 04:37 PM   Modules accepted: Orders

## 2012-09-22 ENCOUNTER — Telehealth: Payer: Self-pay | Admitting: *Deleted

## 2012-09-22 ENCOUNTER — Other Ambulatory Visit: Payer: Self-pay | Admitting: *Deleted

## 2012-09-22 LAB — VITAMIN D 25 HYDROXY (VIT D DEFICIENCY, FRACTURES): Vit D, 25-Hydroxy: 59 ng/mL (ref 30–89)

## 2012-09-22 LAB — NMR LIPOPROFILE WITH LIPIDS
HDL Particle Number: 28.7 umol/L — ABNORMAL LOW (ref >=30.5)
HDL Size: 9.9 nm (ref >=9.2)
HDL-C: 48 mg/dL (ref >=40)
Large HDL-P: 12 umol/L (ref >=4.8)
Triglycerides: 77 mg/dL (ref <150)

## 2012-09-22 MED ORDER — HYDROXYCHLOROQUINE SULFATE 200 MG PO TABS: 200.0000 mg | ORAL_TABLET | Freq: Every day | ORAL | Status: AC

## 2012-09-22 NOTE — Telephone Encounter (Signed)
Message copied by Bearl Mulberry on Wed Sep 22, 2012  6:40 PM ------      Message from: Ernestina Penna      Created: Wed Sep 22, 2012  6:06 PM       Electrolytes blood sugar and kidney function tests all within normal limit      On advanced lipid testing the total LDL particle number was close to goal at 1028, glycerides were good, and the HDL particle number was decreased--- continue current treatment and aggressive therapeutic lifestyle changes      Vitamin D level was good continue current treatment      1 liver function tests was elevated more at 408+++++++, this is probably bone related, all other function tests were within normal limits       ------

## 2012-09-22 NOTE — Telephone Encounter (Signed)
Pt's wife notified of results. 

## 2012-10-05 ENCOUNTER — Telehealth: Payer: Self-pay | Admitting: Oncology

## 2012-10-05 ENCOUNTER — Ambulatory Visit (HOSPITAL_BASED_OUTPATIENT_CLINIC_OR_DEPARTMENT_OTHER): Payer: Medicare Other | Admitting: Oncology

## 2012-10-05 ENCOUNTER — Other Ambulatory Visit (HOSPITAL_BASED_OUTPATIENT_CLINIC_OR_DEPARTMENT_OTHER): Payer: Medicare Other | Admitting: Lab

## 2012-10-05 VITALS — BP 150/86 | HR 85 | Temp 96.8°F | Resp 19 | Ht 71.0 in | Wt 139.1 lb

## 2012-10-05 DIAGNOSIS — C61 Malignant neoplasm of prostate: Secondary | ICD-10-CM

## 2012-10-05 LAB — CBC WITH DIFFERENTIAL/PLATELET
Basophils Absolute: 0 10*3/uL (ref 0.0–0.1)
EOS%: 1.2 % (ref 0.0–7.0)
Eosinophils Absolute: 0.1 10*3/uL (ref 0.0–0.5)
LYMPH%: 21.3 % (ref 14.0–49.0)
MCH: 28.7 pg (ref 27.2–33.4)
MCV: 93.2 fL (ref 79.3–98.0)
MONO%: 6.2 % (ref 0.0–14.0)
NEUT#: 5.8 10*3/uL (ref 1.5–6.5)
Platelets: 198 10*3/uL (ref 140–400)
RBC: 3.66 10*6/uL — ABNORMAL LOW (ref 4.20–5.82)

## 2012-10-05 LAB — COMPREHENSIVE METABOLIC PANEL (CC13)
AST: 18 U/L (ref 5–34)
Alkaline Phosphatase: 315 U/L — ABNORMAL HIGH (ref 40–150)
BUN: 14.1 mg/dL (ref 7.0–26.0)
Glucose: 90 mg/dl (ref 70–140)
Total Bilirubin: 0.96 mg/dL (ref 0.20–1.20)

## 2012-10-05 NOTE — Progress Notes (Signed)
Hematology and Oncology Follow Up Visit  [name] [id number] 01/20/1931 79 y.o. 10/05/2012 2:43 PM   Principle Diagnosis: 79 year old with diagnosis of prostate cancer in 1993. Gleason score 4+3=7. 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   Prior Therapy: Androgen deprivation. Lupron + casodex upfront therapy.   Current therapy: Lupron given at [facility]. He started Zytiga 1000 mg daily in May 2014 but stopped in 08/2012 due to poor tolerance.   Interim History: Mr. [name] is a 79 year old man returns for a follow up visit. He is S/P TURP on 11/15 due to urinary retention. The pathology did show more prostate cancer. The patient started Zytiga in May 2014 due to rising PSA and progression of bony metastatic disease on his bone scan in May 2014 but stopped in 08/2012 due to GI complications and has been off treatment for few weeks. Clinically, Mr. [name] reports feeling relatively fair. He is able to ambulate with the help of a cane and does not drive. He has mild fatigue. He does not have any back pain, shoulder pain. He does not really report any decline in his energy or performance status. He has, again, urinary retention as his biggest issue at this time. He reports lower leg pain with ambulation. Denies abdominal pain, nausea, vomiting. All his symptoms have resolved now.   Medications: I have reviewed the patient's current medications.  Current Outpatient Prescriptions  Medication Sig Dispense Refill  . HYDROcodone-acetaminophen (NORCO/VICODIN) 5-325 MG per tablet Take 1 tablet by mouth every 6 (six) hours as needed for pain.  120 tablet  5  . hydrocortisone (ANUSOL-HC) 25 MG suppository Place 25 mg rectally as needed.       . hydroxychloroquine (PLAQUENIL) 200 MG tablet Take 1 tablet (200 mg total) by mouth daily.  30 tablet  11  . leuprolide (LUPRON) 30 MG injection Inject 30 mg into the muscle every 6 (six) months.       . methotrexate (RHEUMATREX) 2.5  MG tablet TAKE AS DIRECTED  16 tablet  1  . pravastatin (PRAVACHOL) 40 MG tablet Take 1 tablet (40 mg total) by mouth at bedtime.  30 tablet  5  . predniSONE (DELTASONE) 5 MG tablet Take 1 tablet (5 mg total) by mouth at bedtime.  30 tablet  0  . traZODone (DESYREL) 50 MG tablet Take 1 tablet (50 mg total) by mouth at bedtime.  30 tablet  12   No current facility-administered medications for this visit.    Allergies:  Allergies  Allergen Reactions  . Ciprofloxacin Diarrhea    Severe diarrhea/colitis    Past Medical History, Surgical history, Social history, and Family History were reviewed and updated.  Review of Systems: Constitutional:  Negative for fever, chills, night sweats, anorexia, weight loss, pain. Cardiovascular: no chest pain or dyspnea on exertion Respiratory: negative Neurological: negative Dermatological: negative ENT: negative Skin: Negative. Gastrointestinal: negative Genito-Urinary: negative Hematological and Lymphatic: negative Breast: negative Musculoskeletal: negative Remaining ROS negative.  Physical Exam: Blood pressure 150/86, pulse 85, temperature 96.8 F (36 C), temperature source Oral, resp. rate 19, height 5\' 11"  (1.803 m), weight 139 lb 1.6 oz (63.095 kg). ECOG: 1 General appearance: alert Head: Normocephalic, without obvious abnormality, atraumatic Neck: no adenopathy, no carotid bruit, no JVD, supple, symmetrical, trachea midline and thyroid not enlarged, symmetric, no tenderness/mass/nodules Lymph nodes: Cervical, supraclavicular, and axillary nodes normal. Heart:regular rate and rhythm, S1, S2 normal, no murmur, click, rub or gallop Lung:chest clear, no wheezing,  rales, normal symmetric air entry Abdomen: soft, non-tender, without masses or organomegaly EXT:no erythema, induration, or nodules   Lab Results: Lab Results  Component Value Date   WBC 8.2 10/05/2012   HGB 10.5* 10/05/2012   HCT 34.1* 10/05/2012   MCV 93.2 10/05/2012   PLT  198 10/05/2012     Chemistry      Component Value Date/Time   NA 141 10/05/2012 1404   NA 140 09/21/2012 1033   K 3.8 10/05/2012 1404   K 4.6 09/21/2012 1033   CL 106 09/21/2012 1033   CL 104 08/31/2012 0943   CO2 24 10/05/2012 1404   CO2 26 09/21/2012 1033   BUN 14.1 10/05/2012 1404   BUN 13 09/21/2012 1033   CREATININE 0.7 10/05/2012 1404   CREATININE 0.56 09/21/2012 1033   CREATININE 0.80 02/23/2011 1150      Component Value Date/Time   CALCIUM 9.0 10/05/2012 1404   CALCIUM 9.2 09/21/2012 1033   ALKPHOS 315* 10/05/2012 1404   ALKPHOS 408* 09/21/2012 1033   AST 18 10/05/2012 1404   AST 22 09/21/2012 1033   ALT 7 10/05/2012 1404   ALT 10 09/21/2012 1033   BILITOT 0.96 10/05/2012 1404   BILITOT 1.1 09/21/2012 1033     Results for Javier, Rodgers (MRN [id number]) as of 10/05/2012 14:32  Ref. Range 07/20/2012 09:54 08/31/2012 09:43  PSA Latest Range: <=4.00 ng/mL 113.10 (H) 118.70 (H)    Impression and Plan:  This is a pleasant 79 year old gentleman with the  following issues.  1. Prostate cancer. He stated he was diagnosed dating back to 52. He had a Gleason score of 7. His PSA is up to 136 which has doubled in about 4 months. He does have local relapse and urinary retention. He is S/P TURP by Dr. Mena Goes on 11/15 with PSA down to 31. Since then he had a sharp rise in his PSA with bone mets on his bone scan done in May 2014.  He took [place] for about a month but decided to stop due to GI complications and he is not willing to try anything else at this time. We plan to hold off on any treatment unless he becomes symptomatic to honor his wishes. We will continue with observation and follow up.   2. Androgen deprivation: he will continue on Lupron.  3. Bone health: he is on calcium and vitamin D. No plans for Xgeva due to patient's wishes.   4. Follow up in 8 weeks.    SHADAD,FIRAS 7/29/20142:43 PM

## 2012-10-05 NOTE — Telephone Encounter (Signed)
gv pt appt schedule for September.  °

## 2012-10-08 ENCOUNTER — Telehealth: Payer: Self-pay | Admitting: Family Medicine

## 2012-10-15 ENCOUNTER — Other Ambulatory Visit: Payer: Self-pay | Admitting: *Deleted

## 2012-10-15 MED ORDER — PREDNISONE 5 MG PO TABS: 5.0000 mg | ORAL_TABLET | Freq: Every day | ORAL | Status: DC

## 2012-10-15 NOTE — Telephone Encounter (Signed)
Patient last seen in office on 09-21-12. Please advise

## 2012-10-19 ENCOUNTER — Other Ambulatory Visit: Payer: Self-pay

## 2012-10-19 MED ORDER — PREDNISONE 5 MG PO TABS: 5.0000 mg | ORAL_TABLET | Freq: Every day | ORAL | Status: DC

## 2012-11-10 ENCOUNTER — Telehealth: Payer: Self-pay | Admitting: Family Medicine

## 2012-11-12 NOTE — Telephone Encounter (Signed)
Per pt it has already been picked up

## 2012-11-30 ENCOUNTER — Encounter: Payer: Self-pay | Admitting: Family

## 2012-11-30 ENCOUNTER — Other Ambulatory Visit (HOSPITAL_BASED_OUTPATIENT_CLINIC_OR_DEPARTMENT_OTHER): Payer: Medicare Other | Admitting: Lab

## 2012-11-30 ENCOUNTER — Ambulatory Visit (HOSPITAL_BASED_OUTPATIENT_CLINIC_OR_DEPARTMENT_OTHER): Payer: Medicare Other | Admitting: Family

## 2012-11-30 ENCOUNTER — Telehealth: Payer: Self-pay | Admitting: Oncology

## 2012-11-30 VITALS — BP 124/85 | HR 101 | Temp 97.0°F | Resp 20 | Ht 71.0 in | Wt 134.6 lb

## 2012-11-30 DIAGNOSIS — C61 Malignant neoplasm of prostate: Secondary | ICD-10-CM

## 2012-11-30 LAB — COMPREHENSIVE METABOLIC PANEL (CC13)
BUN: 16.2 mg/dL (ref 7.0–26.0)
CO2: 25 mEq/L (ref 22–29)
Creatinine: 0.7 mg/dL (ref 0.7–1.3)
Glucose: 111 mg/dl (ref 70–140)
Sodium: 140 mEq/L (ref 136–145)
Total Bilirubin: 0.78 mg/dL (ref 0.20–1.20)
Total Protein: 7.2 g/dL (ref 6.4–8.3)

## 2012-11-30 LAB — CBC WITH DIFFERENTIAL/PLATELET
Basophils Absolute: 0.1 10*3/uL (ref 0.0–0.1)
Eosinophils Absolute: 0.1 10*3/uL (ref 0.0–0.5)
HCT: 32.2 % — ABNORMAL LOW (ref 38.4–49.9)
HGB: 10.5 g/dL — ABNORMAL LOW (ref 13.0–17.1)
LYMPH%: 18.7 % (ref 14.0–49.0)
MONO#: 0.4 10*3/uL (ref 0.1–0.9)
NEUT#: 5.9 10*3/uL (ref 1.5–6.5)
NEUT%: 74 % (ref 39.0–75.0)
Platelets: 349 10*3/uL (ref 140–400)
WBC: 8 10*3/uL (ref 4.0–10.3)

## 2012-11-30 LAB — PSA: PSA: 363.5 ng/mL — ABNORMAL HIGH (ref <=4.00)

## 2012-11-30 NOTE — Progress Notes (Addendum)
[facility]  Telephone:(336) [phone] Fax:(336) [phone]  OFFICE PROGRESS NOTE  ID: [name]   DOB: 1930/06/30  MR#: [id number]  OVF#:[id number]   PCP: Rudi Heap, MD   Principle Diagnosis: 79 y.o.  with diagnosis of prostate cancer in 1993. Gleason score 4+3=7. 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   Prior Therapy: Androgen deprivation. Lupron + Casodex upfront therapy.   Current therapy: Lupron given at [facility]. He started Zytiga 1000 mg daily in 07/2012 but stopped in 08/2012 due to poor tolerance.   Interim History:  Dr. Clelia Croft and I saw [name] today for followup of prostate cancer.  He is accompanied by his wife Javier for today's office visit.   It will be recalled that [name] is S/P TURP on 01/23/2012 due to urinary retention. The pathology showed more prostate cancer. The patient started Zytiga in 07/2012 due to rising PSA and progression of bony metastatic disease as revealed by a bone scan in 07/2012.  He discontinued taking Zytiga in 08/2012 due to GI complications and has been off treatment since that time. Clinically, [name] reports feeling relatively fair. He is able to ambulate with the help of a cane and does not drive. He has continuing fatigue.  He states that he goes to bed approximately 7-8 PM every evening but wakes up at approximately 10-11 PM when he gets a urinary "urge."   He states he self catheterizes at that time.  Otherwise he says his urinary stream is a "trickle" but enough to provide him relief of the urge.  He states he is up and down every 2 hours all night long and does not get restful sleep.  He reports chronic back pain and leg pain that is relieved by rest.  His myalgias/arthralgias are worse in the morning. He does report a slight decline in his energy and performance status due to fatigue secondary to insomnia.  He experienced abdominal pain for 3-4 days but experienced relief after taking  Pepto-Bismol.  Urinary retention continues to be his biggest issue at this time.  His interval history is otherwise unremarkable.   Medications: I have reviewed the patient's current medications.  Current Outpatient Prescriptions  Medication Sig Dispense Refill  . HYDROcodone-acetaminophen (NORCO/VICODIN) 5-325 MG per tablet Take 1 tablet by mouth every 6 (six) hours as needed for pain.  120 tablet  5  . hydrocortisone (ANUSOL-HC) 25 MG suppository Place 25 mg rectally as needed.       . hydroxychloroquine (PLAQUENIL) 200 MG tablet Take 1 tablet (200 mg total) by mouth daily.  30 tablet  11  . leuprolide (LUPRON) 30 MG injection Inject 30 mg into the muscle every 6 (six) months.       . methotrexate (RHEUMATREX) 2.5 MG tablet TAKE AS DIRECTED  16 tablet  1  . pravastatin (PRAVACHOL) 40 MG tablet Take 1 tablet (40 mg total) by mouth at bedtime.  30 tablet  5  . predniSONE (DELTASONE) 5 MG tablet Take 1 tablet (5 mg total) by mouth at bedtime.  30 tablet  0  . traZODone (DESYREL) 50 MG tablet Take 1 tablet (50 mg total) by mouth at bedtime.  30 tablet  12   No current facility-administered medications for this visit.    Allergies:  Allergies  Allergen Reactions  . Ciprofloxacin Diarrhea    Severe diarrhea/colitis    Past Medical History, Surgical history, Social history, and Family History were reviewed and updated.  Review of Systems: A 10 point review of systems was completed and is negative except as noted above.  [name] specifically denies any other symptomatology including fever or chills, headache, vision changes, swollen glands, cough or shortness of breath, chest pain or discomfort, nausea, vomiting, diarrhea, constipation, change in urinary or bowel habits, unusual bleeding/bruising or any other symptomatology.   Physical Exam: Blood pressure 124/85, pulse 101, temperature 97 F (36.1 C), temperature source Oral, resp. rate 20, height 5\' 11"  (1.803 m), weight 134 lb 9.6  oz (61.054 kg).  ECOG FS: 1 - Symptomatic but completely ambulatory  General appearance: Alert, cooperative, thin frame, no apparent distress Head: Normocephalic, without obvious abnormality, small frontal laceration, upper and lower dentures, HOH Eyes: Arcus senilis, PERRLA, EOMI Nose: Nares, septum and mucosa are normal, no drainage or sinus tenderness Neck: No adenopathy, supple, symmetrical, trachea midline, no tenderness Resp: Clear to auscultation bilaterally, no wheezes/rales/rhonchi Cardio: Distant heart sounds that appeared slightly abnormal, no murmur, click, rub or gallop, no edema GI: Soft, not distended, non-tender, hypoactive bowel sounds, no organomegaly Skin: Areas of hyperpigmentation on face and bilateral upper extremities, skin warm and dry, no cyanosis  M/S:  Atraumatic, 3/5 strength in all extremities, normal range of motion, contracted fingers bilaterally  Lymph nodes: Cervical, supraclavicular, and axillary nodes normal Neurologic: Grossly normal, cranial nerves II through XII intact, alert and oriented x 3, ambulates with a cane Psych: Appropriate affect   Lab Results: Lab Results  Component Value Date   WBC 8.0 11/30/2012   HGB 10.5* 11/30/2012   HCT 32.2* 11/30/2012   MCV 91.3 11/30/2012   PLT 349 11/30/2012     Chemistry      Component Value Date/Time   NA 140 11/30/2012 1340   NA 140 09/21/2012 1033   K 3.8 11/30/2012 1340   K 4.6 09/21/2012 1033   CL 106 09/21/2012 1033   CL 104 08/31/2012 0943   CO2 25 11/30/2012 1340   CO2 26 09/21/2012 1033   BUN 16.2 11/30/2012 1340   BUN 13 09/21/2012 1033   CREATININE 0.7 11/30/2012 1340   CREATININE 0.56 09/21/2012 1033   CREATININE 0.80 02/23/2011 1150      Component Value Date/Time   CALCIUM 9.3 11/30/2012 1340   CALCIUM 9.2 09/21/2012 1033   ALKPHOS 539* 11/30/2012 1340   ALKPHOS 408* 09/21/2012 1033   AST 35* 11/30/2012 1340   AST 22 09/21/2012 1033   ALT 11 11/30/2012 1340   ALT 10 09/21/2012 1033   BILITOT 0.78  11/30/2012 1340   BILITOT 1.1 09/21/2012 1033     Results for [name] (MRN [id number]) as of 10/05/2012 14:32  Ref. Range 07/20/2012 09:54 08/31/2012 09:43 10/05/2012 14:04 11/30/2012 13:41  PSA Latest Range: <=4.00 ng/mL 113.10 (H) 118.70 (H) 120.70 (H) 363.50 (H)    Impression and Plan: Mr. Lamay is a pleasant 79 y.o.  gentleman with the following issues:   1. Prostate cancer. He states his diagnosis dates back to 79. He had a Gleason score of 7. His PSA is up to 363.59 which has tripled in about 4 months. He does have local relapse and urinary retention. He is S/P TURP by Dr. Mena Goes on 01/23/2012 with PSA down to 31. Since then he had a sharp rise in his PSA with bone mets on his bone scan done in 07/2012. He took [place] for about a month but decided to stop due to GI complications and he is not willing to try anything else at  this time. We plan to hold off on any treatment unless he becomes symptomatic to honor his wishes. We will continue with observation and follow up.   2. Androgen deprivation: He will continue on Lupron administered at [facility].  3. Bone health:  No plans for Xgeva due to patient's wishes.   4.  Insomnia: Mr. Wilmer was asked to use prescribed Trazodone consistently for insomnia and subsequent fatigue.  4. Follow up in 8 weeks at which time we will check a CBC, CMP, and PSA level.   Larina Bras NP-C 9/24/20145:45 PM    Patient seen and examined. He is not reporting any clinical decline or bone pain. He still functions rather well.  No pain is reported.   Despite his progressive prostate cancer he remains asymptomatic. His wishes right now is not to proceed with any therapy and continue with supportive management. He understands that his cancer might eventually take over his body and he is still not willing to  go through with any treatment. I discussed that with him today extensively and we will continue to monitor him clinically at the  time being.

## 2012-11-30 NOTE — Patient Instructions (Signed)
Please contact us at [phone] if you have any questions or concerns.  Please continue to do well and enjoy life!!!  Get plenty of rest, drink plenty of water, exercise daily (walking), eat a balanced diet.  Results for orders placed in visit on 11/30/12 (from the past 24 hour(s))  CBC WITH DIFFERENTIAL     Status: Abnormal   Collection Time    11/30/12  1:40 PM      Result Value Range   WBC 8.0  4.0 - 10.3 10e3/uL   NEUT# 5.9  1.5 - 6.5 10e3/uL   HGB 10.5 (*) 13.0 - 17.1 g/dL   HCT 16.1 (*) 09.6 - 04.5 %   Platelets 349  140 - 400 10e3/uL   MCV 91.3  79.3 - 98.0 fL   MCH 29.7  27.2 - 33.4 pg   MCHC 32.5  32.0 - 36.0 g/dL   RBC 4.09 (*) 8.11 - 9.14 10e6/uL   RDW 18.2 (*) 11.0 - 14.6 %   lymph# 1.5  0.9 - 3.3 10e3/uL   MONO# 0.4  0.1 - 0.9 10e3/uL   Eosinophils Absolute 0.1  0.0 - 0.5 10e3/uL   Basophils Absolute 0.1  0.0 - 0.1 10e3/uL   NEUT% 74.0  39.0 - 75.0 %   LYMPH% 18.7  14.0 - 49.0 %   MONO% 5.3  0.0 - 14.0 %   EOS% 1.2  0.0 - 7.0 %   BASO% 0.8  0.0 - 2.0 %   Narrative:    Performed At:  [facility]               [address]               [place], [place] [address]  COMPREHENSIVE METABOLIC PANEL (CC13)     Status: Abnormal   Collection Time    11/30/12  1:40 PM      Result Value Range   Sodium 140  136 - 145 mEq/L   Potassium 3.8  3.5 - 5.1 mEq/L   Chloride 105  98 - 109 mEq/L   CO2 25  22 - 29 mEq/L   Glucose 111  70 - 140 mg/dl   BUN 62.1  7.0 - 30.8 mg/dL   Creatinine 0.7  0.7 - 1.3 mg/dL   Total Bilirubin 6.57  0.20 - 1.20 mg/dL   Alkaline Phosphatase 539 (*) 40 - 150 U/L   AST 35 (*) 5 - 34 U/L   ALT 11  0 - 55 U/L   Total Protein 7.2  6.4 - 8.3 g/dL   Albumin 3.1 (*) 3.5 - 5.0 g/dL   Calcium 9.3  8.4 - 84.6 mg/dL   Narrative:    Note: New Reference ranges.Performed At:  [facility]               [address]               [place], [place] [address]

## 2012-11-30 NOTE — Telephone Encounter (Signed)
gaveb pt appt for lab and MD on November 2014

## 2012-12-01 ENCOUNTER — Encounter: Payer: Self-pay | Admitting: Family

## 2012-12-02 ENCOUNTER — Other Ambulatory Visit: Payer: Self-pay

## 2012-12-02 MED ORDER — METHOTREXATE 2.5 MG PO TABS: ORAL_TABLET | ORAL | Status: DC

## 2012-12-02 NOTE — Telephone Encounter (Signed)
Last seen 09/21/12  DWM

## 2012-12-03 ENCOUNTER — Telehealth: Payer: Self-pay | Admitting: *Deleted

## 2012-12-03 NOTE — Telephone Encounter (Signed)
Spoke with wife and daughter, gave PSA results. Patient did not tell them i had called him yesterday.

## 2012-12-06 ENCOUNTER — Encounter: Payer: Self-pay | Admitting: Oncology

## 2012-12-06 NOTE — Progress Notes (Signed)
Per PAN patient was approved for Zytiga. I will send to billing and medical records.

## 2012-12-16 ENCOUNTER — Ambulatory Visit (INDEPENDENT_AMBULATORY_CARE_PROVIDER_SITE_OTHER): Payer: Medicare Other | Admitting: *Deleted

## 2012-12-16 ENCOUNTER — Encounter (INDEPENDENT_AMBULATORY_CARE_PROVIDER_SITE_OTHER): Payer: Self-pay

## 2012-12-16 DIAGNOSIS — Z23 Encounter for immunization: Secondary | ICD-10-CM

## 2012-12-17 ENCOUNTER — Other Ambulatory Visit: Payer: Self-pay | Admitting: *Deleted

## 2012-12-17 MED ORDER — PREDNISONE 5 MG PO TABS: 5.0000 mg | ORAL_TABLET | Freq: Every day | ORAL | Status: DC

## 2013-01-11 ENCOUNTER — Other Ambulatory Visit: Payer: Self-pay | Admitting: *Deleted

## 2013-01-11 MED ORDER — PREDNISONE 5 MG PO TABS: 5.0000 mg | ORAL_TABLET | Freq: Every day | ORAL | Status: AC

## 2013-02-01 ENCOUNTER — Other Ambulatory Visit: Payer: Medicare Other | Admitting: Lab

## 2013-02-01 ENCOUNTER — Ambulatory Visit: Payer: Medicare Other | Admitting: Oncology

## 2013-02-08 ENCOUNTER — Other Ambulatory Visit: Payer: Self-pay | Admitting: Family Medicine

## 2013-02-08 MED ORDER — METHOTREXATE 2.5 MG PO TABS: ORAL_TABLET | ORAL | Status: DC

## 2013-02-08 NOTE — Telephone Encounter (Signed)
Not sure of directions

## 2013-02-14 ENCOUNTER — Ambulatory Visit (INDEPENDENT_AMBULATORY_CARE_PROVIDER_SITE_OTHER): Payer: Medicare Other | Admitting: Family Medicine

## 2013-02-14 ENCOUNTER — Encounter: Payer: Self-pay | Admitting: Family Medicine

## 2013-02-14 VITALS — BP 117/71 | HR 107 | Temp 97.4°F | Ht 71.0 in | Wt 130.0 lb

## 2013-02-14 DIAGNOSIS — Z23 Encounter for immunization: Secondary | ICD-10-CM

## 2013-02-14 DIAGNOSIS — J449 Chronic obstructive pulmonary disease, unspecified: Secondary | ICD-10-CM

## 2013-02-14 DIAGNOSIS — M899 Disorder of bone, unspecified: Secondary | ICD-10-CM

## 2013-02-14 DIAGNOSIS — G8929 Other chronic pain: Secondary | ICD-10-CM

## 2013-02-14 DIAGNOSIS — M069 Rheumatoid arthritis, unspecified: Secondary | ICD-10-CM

## 2013-02-14 DIAGNOSIS — C61 Malignant neoplasm of prostate: Secondary | ICD-10-CM

## 2013-02-14 DIAGNOSIS — M545 Low back pain: Secondary | ICD-10-CM

## 2013-02-14 DIAGNOSIS — Z8679 Personal history of other diseases of the circulatory system: Secondary | ICD-10-CM

## 2013-02-14 DIAGNOSIS — M8580 Other specified disorders of bone density and structure, unspecified site: Secondary | ICD-10-CM

## 2013-02-14 NOTE — Addendum Note (Signed)
Addended by: Magdalene River on: 02/14/2013 10:43 AM   Modules accepted: Orders

## 2013-02-14 NOTE — Patient Instructions (Addendum)
Continue current medications. Continue good therapeutic lifestyle changes which include good diet and exercise. Fall precautions discussed with patient. Schedule your flu vaccine if you haven't had it yet If you are over 77 years old - you may need Prevnar 13 or the adult Pneumonia vaccine. Try  MiraLax over-the-counter for constipation Drink plenty of fluids Stay as active as possible Use a cool mist humidifier in the bedroom at nighttime If patient needs something for cough and congestion, try Mucinex, blue and white in color, over-the-counter, one twice daily for cough and congestion

## 2013-02-14 NOTE — Progress Notes (Addendum)
Subjective:    Patient ID: [name], male    DOB: [birth year], 79 y.o.   MRN: [id number]  HPI Pt here for follow up and management of chronic medical problems. Patient comes in today for followup. His biggest problem continues to be the metastatic prostate cancer to his back. This causes a lot of pain. He also has rheumatoid arthritis. He also has degenerative spondylosis and spondylolisthesis. Patient comes in today with his wife. He did not tolerate the chemotherapy offered by the oncologist. He is therefore refusing to take any other treatment. Patient is also considered to be a home-bound patient due to his declining state.       Patient Active Problem List   Diagnosis Date Noted  . Other and unspecified hyperlipidemia   . Carotid artery bruit   . Claudication   . Spondylisthesis   . Spondylosis, lumbosacral   . COPD (chronic obstructive pulmonary disease)   . Osteopenia   . Basal cell carcinoma   . INTERNAL HEMORRHOIDS 02/15/2009  . COLONIC POLYPS, ADENOMATOUS, HX OF 02/15/2009  . PROSTATE CANCER 06/27/2008  . MITRAL REGURGITATION 06/27/2008  . AORTIC SCLEROSIS 06/27/2008  . ARTHRITIS, RHEUMATOID 06/27/2008   Outpatient Encounter Prescriptions as of 02/14/2013  Medication Sig  . HYDROcodone-acetaminophen (NORCO/VICODIN) 5-325 MG per tablet Take 1 tablet by mouth every 6 (six) hours as needed for pain.  . hydroxychloroquine (PLAQUENIL) 200 MG tablet Take 1 tablet (200 mg total) by mouth daily.  Marland Kitchen leuprolide (LUPRON) 30 MG injection Inject 30 mg into the muscle every 6 (six) months.   . methotrexate (RHEUMATREX) 2.5 MG tablet TAKE AS DIRECTED  . pravastatin (PRAVACHOL) 40 MG tablet Take 1 tablet (40 mg total) by mouth at bedtime.  . predniSONE (DELTASONE) 5 MG tablet Take 1 tablet (5 mg total) by mouth at bedtime.  . traZODone (DESYREL) 50 MG tablet Take 1 tablet (50 mg total) by mouth at bedtime.  . [DISCONTINUED] hydrocortisone (ANUSOL-HC) 25 MG suppository Place 25  mg rectally as needed.   . [DISCONTINUED] methotrexate (RHEUMATREX) 2.5 MG tablet TAKE AS DIRECTED    Review of Systems  Constitutional: Negative.   HENT: Negative.   Eyes: Negative.   Respiratory: Negative.   Cardiovascular: Negative.   Gastrointestinal: Negative.   Endocrine: Negative.   Genitourinary: Negative.   Musculoskeletal: Positive for back pain (prostate cancer making pain worse).  Skin: Negative.   Allergic/Immunologic: Negative.   Neurological: Negative.   Hematological: Negative.   Psychiatric/Behavioral: Negative.        Objective:   Physical Exam  Nursing note and vitals reviewed. Constitutional: He is oriented to person, place, and time. No distress.  Thin and frail and pale in color. Pleasant and accepting of his condition  HENT:  Head: Normocephalic and atraumatic.  Right Ear: External ear normal.  Left Ear: External ear normal.  Nose: Nose normal.  Mouth/Throat: Oropharynx is clear and moist. No oropharyngeal exudate.  Eyes: Conjunctivae and EOM are normal. Pupils are equal, round, and reactive to light. Right eye exhibits no discharge. Left eye exhibits no discharge. No scleral icterus.  Neck: Normal range of motion. Neck supple. No thyromegaly present.  Patient has bilateral supraclavicular bruits  Cardiovascular: Normal rate, regular rhythm, normal heart sounds and intact distal pulses.  Exam reveals no gallop and no friction rub.   No murmur heard. At 96 per minute  Pulmonary/Chest: Effort normal and breath sounds normal. No respiratory distress. He has no wheezes. He has no rales. He exhibits no  tenderness.  Abdominal: Soft. Bowel sounds are normal. He exhibits no mass. There is tenderness (generalized lower abdominal tenderness). There is no rebound and no guarding.  Musculoskeletal: Normal range of motion. He exhibits no edema and no tenderness.  Lymphadenopathy:    He has no cervical adenopathy.  Neurological: He is alert and oriented to person,  place, and time. He has normal reflexes. No cranial nerve deficit.  Skin: Skin is warm and dry. No rash noted. He is not diaphoretic. No erythema. No pallor.  Psychiatric: He has a normal mood and affect. His behavior is normal. Judgment and thought content normal.  Quiet affect, but this is usual for him   BP 117/71  Pulse 107  Temp(Src) 97.4 F (36.3 C) (Oral)  Ht 5\' 11"  (1.803 m)  Wt 130 lb (58.968 kg)  BMI 18.14 kg/m2        Assessment & Plan:  1. ARTHRITIS, RHEUMATOID - POCT CBC; Future - Hepatic function panel; Future - BMP8+EGFR; Future - NMR, lipoprofile; Future - Vit D  25 hydroxy (rtn osteoporosis monitoring); Future  2. COPD (chronic obstructive pulmonary disease) - POCT CBC; Future - Hepatic function panel; Future - BMP8+EGFR; Future - NMR, lipoprofile; Future - Vit D  25 hydroxy (rtn osteoporosis monitoring); Future  3. Osteopenia - POCT CBC; Future - Hepatic function panel; Future - BMP8+EGFR; Future - NMR, lipoprofile; Future - Vit D  25 hydroxy (rtn osteoporosis monitoring); Future  4. Prostate cancer - POCT CBC; Future - Hepatic function panel; Future - BMP8+EGFR; Future - NMR, lipoprofile; Future - Vit D  25 hydroxy (rtn osteoporosis monitoring); Future  5. Chronic low back pain  6. History of ASCVD  Patient will receive a Prevnar vaccine today.  Patient Instructions  Continue current medications. Continue good therapeutic lifestyle changes which include good diet and exercise. Fall precautions discussed with patient. Schedule your flu vaccine if you haven't had it yet If you are over 49 years old - you may need Prevnar 13 or the adult Pneumonia vaccine. Try  MiraLax over-the-counter for constipation Drink plenty of fluids Stay as active as possible Use a cool mist humidifier in the bedroom at nighttime If patient needs something for cough and congestion, try Mucinex, blue and white in color, over-the-counter, one twice daily for cough  and congestion    Nyra Capes MD

## 2013-02-16 NOTE — Addendum Note (Signed)
Addended by: Tommas Olp on: 02/16/2013 08:35 AM   Modules accepted: Orders

## 2013-02-16 NOTE — Addendum Note (Signed)
Addended by: Orma Render F on: 02/16/2013 11:52 AM   Modules accepted: Orders

## 2013-02-17 LAB — CBC WITH DIFFERENTIAL
Basophils Absolute: 0 10*3/uL (ref 0.0–0.2)
Eosinophils Absolute: 0.1 10*3/uL (ref 0.0–0.4)
HCT: 32.6 % — ABNORMAL LOW (ref 37.5–51.0)
Immature Granulocytes: 0 %
Lymphocytes Absolute: 1.9 10*3/uL (ref 0.7–3.1)
Lymphs: 25 %
MCH: 27.9 pg (ref 26.6–33.0)
MCHC: 30.7 g/dL — ABNORMAL LOW (ref 31.5–35.7)
Monocytes Absolute: 0.6 10*3/uL (ref 0.1–0.9)
Neutrophils Absolute: 4.8 10*3/uL (ref 1.4–7.0)
RDW: 17.6 % — ABNORMAL HIGH (ref 12.3–15.4)

## 2013-02-18 LAB — HEPATIC FUNCTION PANEL
Albumin: 3.9 g/dL (ref 3.5–4.7)
Alkaline Phosphatase: 697 IU/L — ABNORMAL HIGH (ref 39–117)
Bilirubin, Direct: 0.28 mg/dL (ref 0.00–0.40)
Total Bilirubin: 0.8 mg/dL (ref 0.0–1.2)
Total Protein: 6.9 g/dL (ref 6.0–8.5)

## 2013-02-18 LAB — BMP8+EGFR
BUN/Creatinine Ratio: 26 — ABNORMAL HIGH (ref 10–22)
BUN: 17 mg/dL (ref 8–27)
CO2: 22 mmol/L (ref 18–29)
Chloride: 101 mmol/L (ref 97–108)
Creatinine, Ser: 0.65 mg/dL — ABNORMAL LOW (ref 0.76–1.27)
Glucose: 82 mg/dL (ref 65–99)
Sodium: 142 mmol/L (ref 134–144)

## 2013-02-18 LAB — NMR, LIPOPROFILE
Cholesterol: 144 mg/dL (ref <200)
HDL Cholesterol by NMR: 54 mg/dL (ref >=40)
HDL Particle Number: 31.4 umol/L (ref >=30.5)
LP-IR Score: <25 (ref <=45)
Small LDL Particle Number: 121 nmol/L (ref <=527)

## 2013-02-18 LAB — VITAMIN D 25 HYDROXY (VIT D DEFICIENCY, FRACTURES): Vit D, 25-Hydroxy: 53.7 ng/mL (ref 30.0–100.0)

## 2013-02-21 ENCOUNTER — Telehealth: Payer: Self-pay | Admitting: Oncology

## 2013-02-21 NOTE — Telephone Encounter (Signed)
Wife called to r/s nov appt. Wife given new appt for lb/FS 03/25/13 @ 11:30am.

## 2013-02-28 ENCOUNTER — Ambulatory Visit: Payer: Medicare Other | Admitting: Family Medicine

## 2013-03-07 ENCOUNTER — Other Ambulatory Visit: Payer: Self-pay | Admitting: *Deleted

## 2013-03-07 NOTE — Progress Notes (Signed)
Home health referral sent to [facility] for skilled nursing to eval and tx. Also orders given and signed by Dr. Christell Constant to draw CBC,BMP, and LFT

## 2013-03-08 NOTE — Progress Notes (Signed)
[facility] services were cancelled. Pt was referred to [facility] today per pt, wife and daughters request. Dr. Christell Constant gave verbal orders.

## 2013-03-09 ENCOUNTER — Other Ambulatory Visit: Payer: Self-pay | Admitting: *Deleted

## 2013-03-09 ENCOUNTER — Telehealth: Payer: Self-pay | Admitting: Oncology

## 2013-03-09 DIAGNOSIS — Z515 Encounter for palliative care: Secondary | ICD-10-CM

## 2013-03-09 NOTE — Telephone Encounter (Signed)
Dr. Mena Goes office called and advised that Dr. Christell Constant office called and has already put pt on hospice and pt will not need any more appts....emailed Dr. Clelia Croft with this infjo.Marland KitchenMarland KitchenMarland Kitchen

## 2013-03-14 ENCOUNTER — Other Ambulatory Visit: Payer: Self-pay

## 2013-03-14 MED ORDER — METHOTREXATE 2.5 MG PO TABS: ORAL_TABLET | ORAL | Status: AC

## 2013-03-25 ENCOUNTER — Ambulatory Visit: Payer: Medicare Other | Admitting: Oncology

## 2013-03-25 ENCOUNTER — Other Ambulatory Visit: Payer: Medicare Other

## 2013-04-04 ENCOUNTER — Telehealth: Payer: Self-pay | Admitting: *Deleted

## 2013-04-04 NOTE — Telephone Encounter (Signed)
Hospice nurse called and said that patients family wants to d/c all regular meds except prednisone?

## 2013-04-04 NOTE — Telephone Encounter (Signed)
This is okay.

## 2013-04-05 NOTE — Telephone Encounter (Signed)
Prescription sent to hospice to d/c plaquenil,methotrexate, trazodone, pravastatin,vicodin, cont. Only hospice meds and prednisone

## 2013-04-10 DEATH — deceased

## 2013-05-19 ENCOUNTER — Ambulatory Visit: Payer: Medicare Other | Admitting: Family Medicine

## 2013-11-04 ENCOUNTER — Other Ambulatory Visit: Payer: Self-pay | Admitting: *Deleted

## 2014-01-01 IMAGING — CT CT ABD-PELV W/ CM
2 of 6 series · 17 of 46 positions shown, 19 images · IV contrast (OMNIPAQUE)
Comparison: 02/23/2011

CLINICAL DATA: Prostate cancer, prior Lupron injections, urinary
catheter

CT ABDOMEN AND PELVIS WITH CONTRAST
TECHNIQUE: Multidetector CT imaging of the abdomen and pelvis was
performed following the standard protocol during bolus
administration of intravenous contrast.
Contrast: 100mL OMNIPAQUE IOHEXOL 300 MG/ML  SOLN

[Series 2: rtn a/p with · axial · 0.77mm/px · z∈[-571,-221]mm · 14 of 80 slices shown, 16 images]
[im 5/80  soft-tissue]
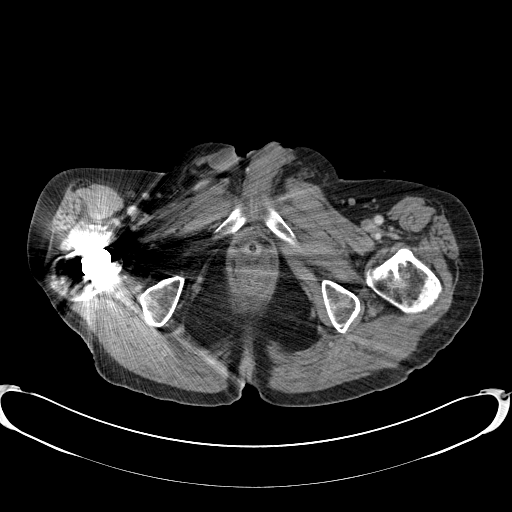
[im 5/80  bone]
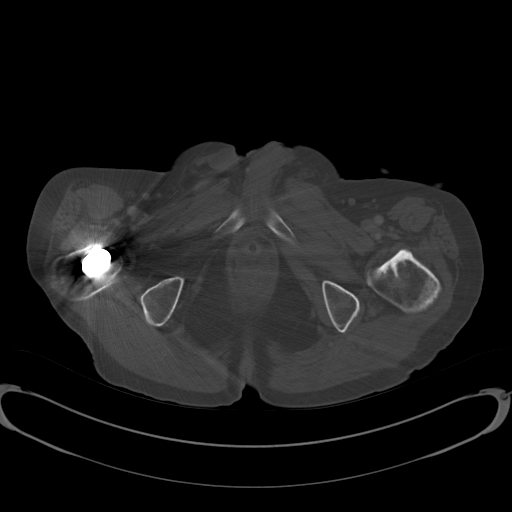
[im 10/80  soft-tissue]
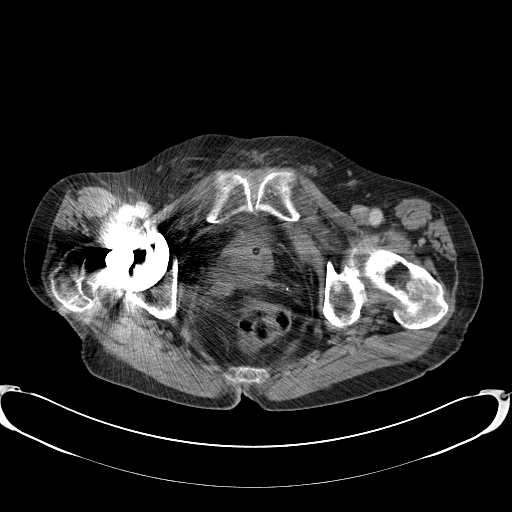
[im 14/80  soft-tissue]
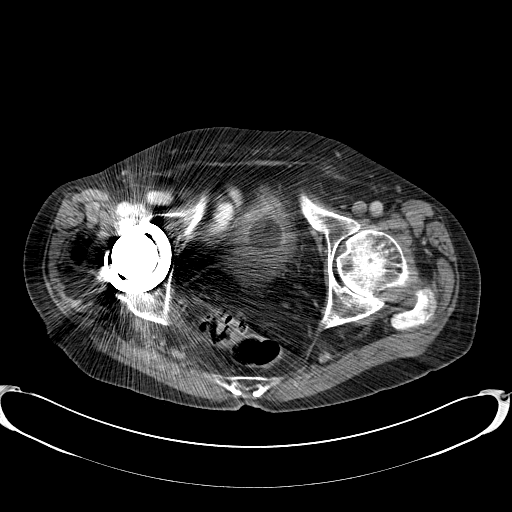
[im 24/80  soft-tissue]
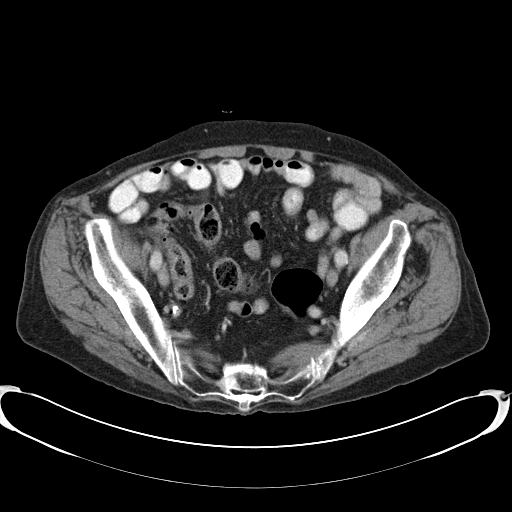
[im 28/80  soft-tissue]
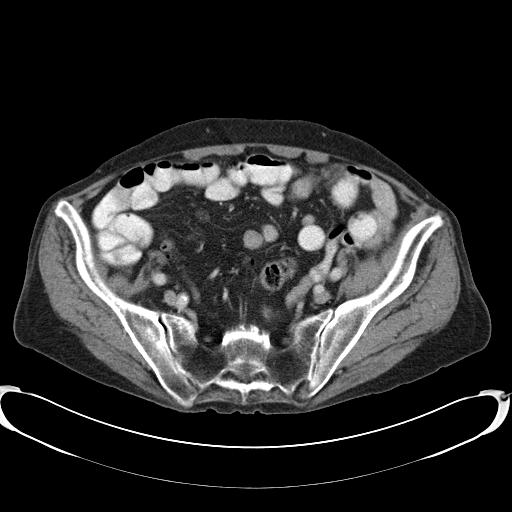
[im 33/80  soft-tissue]
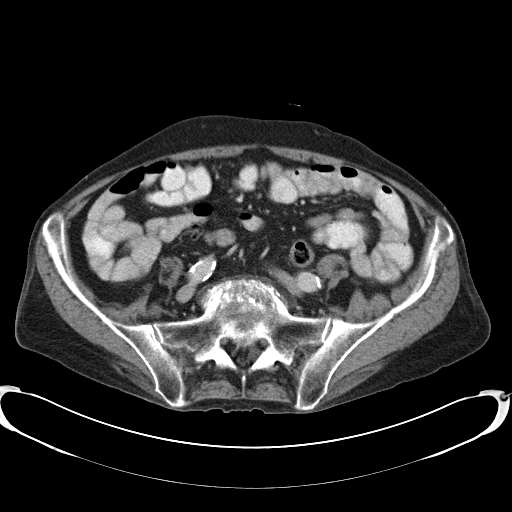
[im 38/80  soft-tissue]
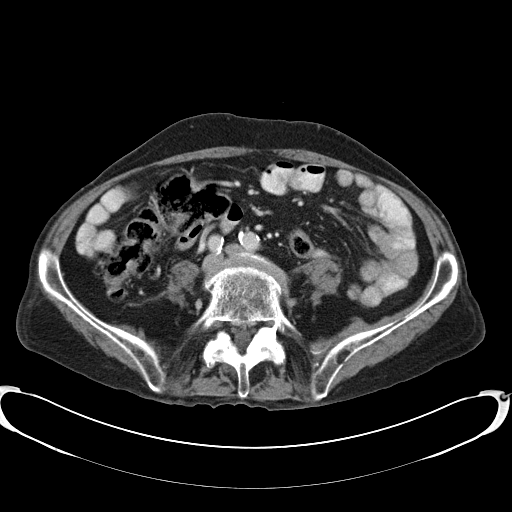
[im 42/80  soft-tissue]
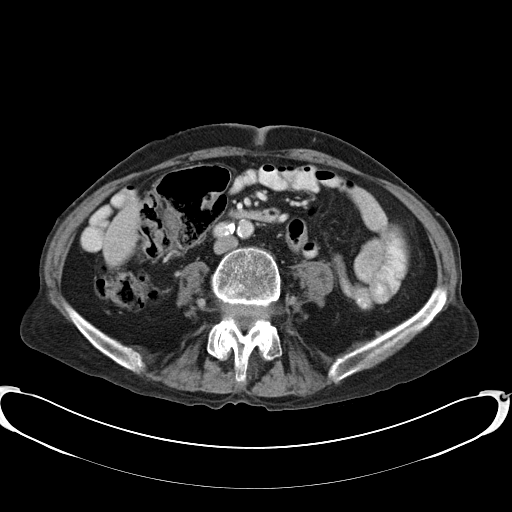
[im 47/80  soft-tissue]
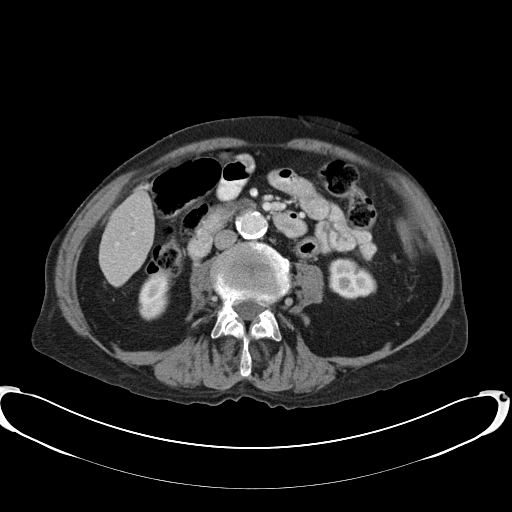
[im 47/80  bone]
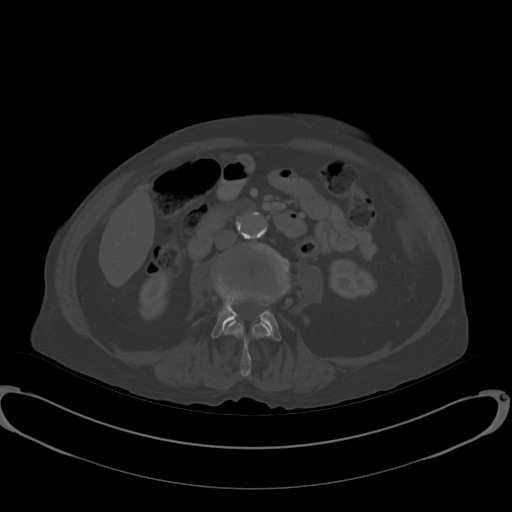
[im 52/80  soft-tissue]
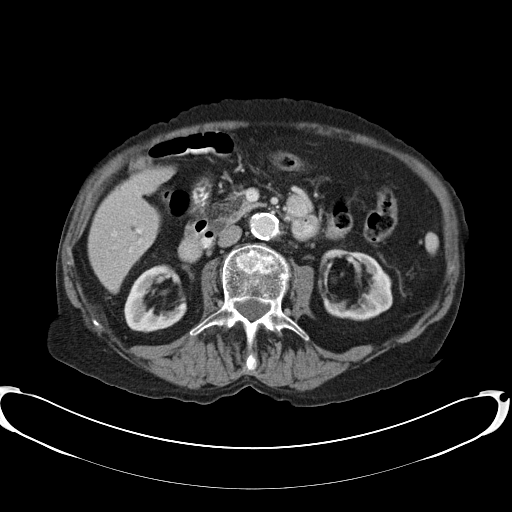
[im 61/80  soft-tissue]
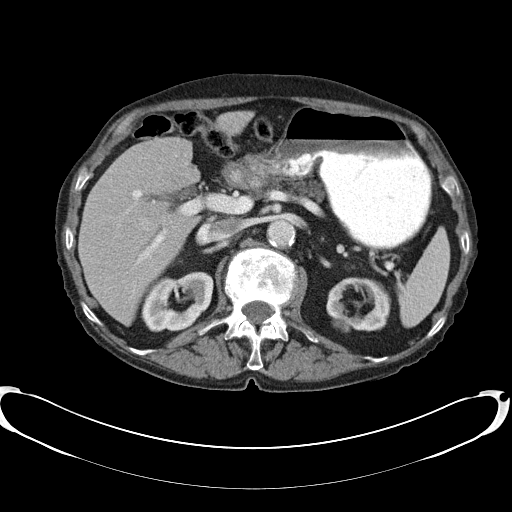
[im 66/80  soft-tissue]
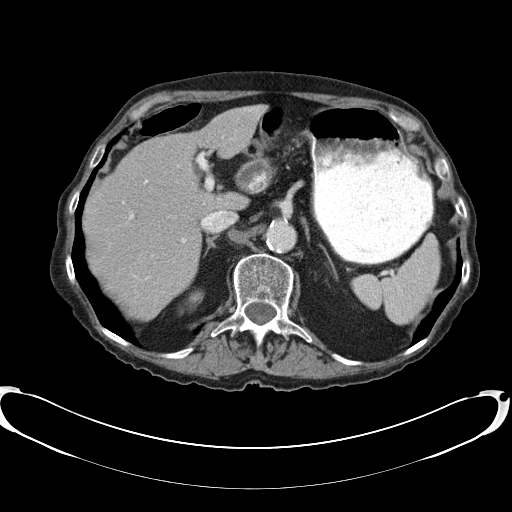
[im 70/80  soft-tissue]
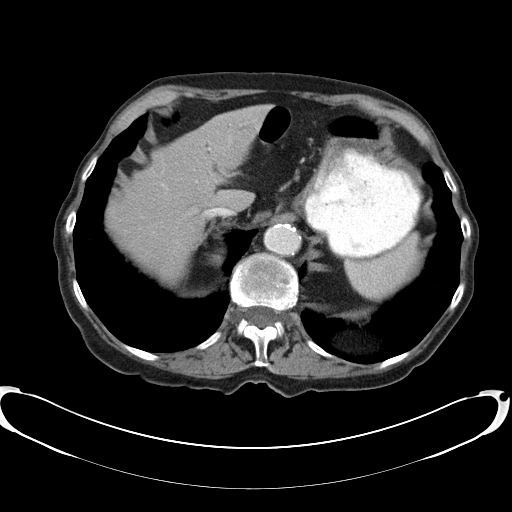
[im 75/80  soft-tissue]
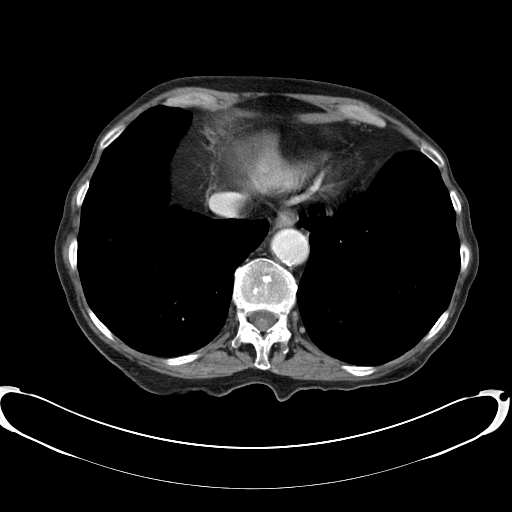

[Series 602: <mpr thick range> · coronal · 0.78mm/px · 3 of 81 slices shown]
[im 27/81  soft-tissue]
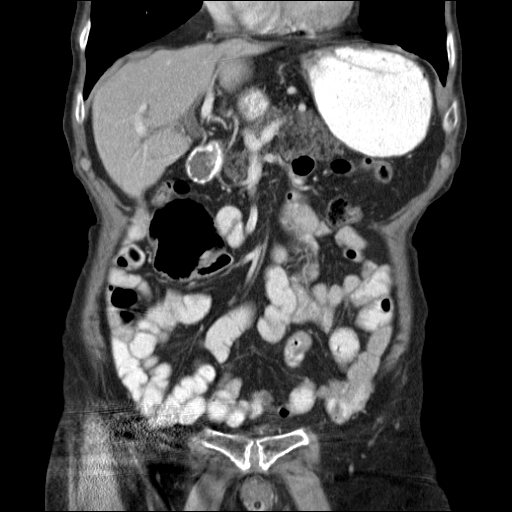
[im 36/81  soft-tissue]
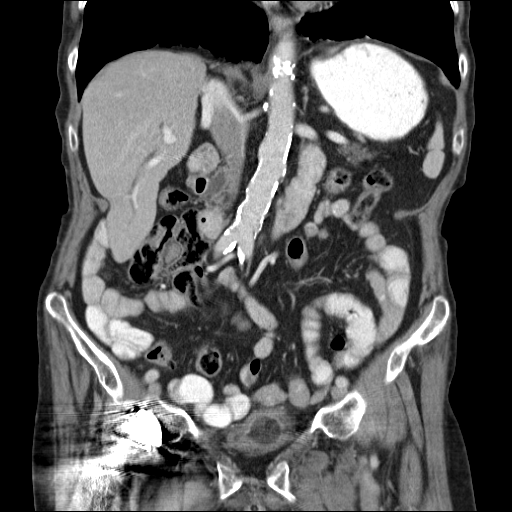
[im 45/81  soft-tissue]
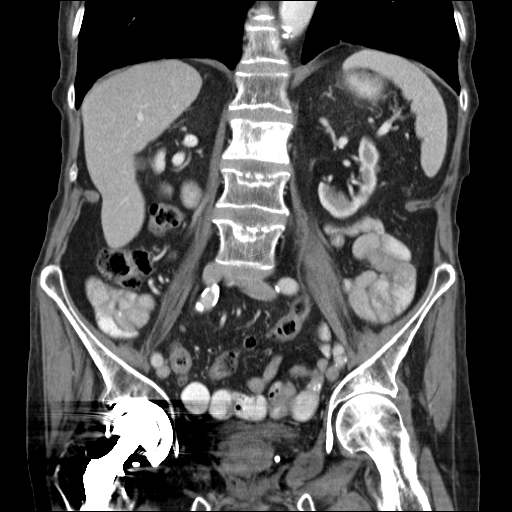

[17 of 46 positions shown; findings below may reference images not displayed]

FINDINGS: Lung bases are clear.

Liver, spleen, pancreas, and adrenal glands are within normal
limits.

Status post cholecystectomy.  Mild intrahepatic and extrahepatic
ductal dilatation, likely postsurgical in the setting of normal
LFTs.

Bilateral renal cysts of varying sizes and complexity, including an
exophytic 1.3 x 1.8 cm left upper pole complex cyst with layering
hemorrhage, unchanged.  No hydronephrosis.

No evidence of bowel obstruction.  Normal appendix.  Colonic
diverticulosis, without associate inflammatory changes.

Atherosclerotic calcifications of the abdominal aorta and branch
vessels.

No abdominopelvic ascites.

Small retroperitoneal lymph nodes which do not meet pathologic CT
size criteria.  No suspicious abdominopelvic lymphadenopathy.

Again noted is asymmetry of the right prostate gland with apparent
surrounding abnormal soft tissue (series 6/images 11, 13, and 14),
worrisome for recurrent tumor.

Bladder is decompressed by indwelling Foley catheter.  Prostate
and/or tumor indents the base of the bladder (series 6/image 8).

Sclerosis involving the left L2 vertebral body (series 2/image 23),
compatible with osseous metastasis.

Right total hip arthroplasty.  Grade 1 anterolisthesis of L4 on L5.
IMPRESSION: Asymmetry of the right prostate gland with surrounding abnormal
soft tissue, worrisome for recurrent tumor.

Prostate and/or tumor indents the base of the bladder.  Cystoscopic
correlation is suggested.

Osseous metastasis involving the left L2 vertebral body.
